# Patient Record
Sex: Male | Born: 1960 | Race: Black or African American | Hispanic: No | Marital: Married | Smoking: Never smoker
Health system: Southern US, Community
[De-identification: ages and names within clinical notes are randomized; demographics above are authoritative.]

## PROBLEM LIST (undated history)

## (undated) DIAGNOSIS — E785 Hyperlipidemia, unspecified: Secondary | ICD-10-CM

## (undated) DIAGNOSIS — N289 Disorder of kidney and ureter, unspecified: Secondary | ICD-10-CM

## (undated) DIAGNOSIS — K219 Gastro-esophageal reflux disease without esophagitis: Secondary | ICD-10-CM

## (undated) DIAGNOSIS — E119 Type 2 diabetes mellitus without complications: Secondary | ICD-10-CM

## (undated) HISTORY — PX: COLONOSCOPY: SHX174

## (undated) HISTORY — PX: NECK SURGERY: SHX720

## (undated) HISTORY — PX: OTHER SURGICAL HISTORY: SHX169

## (undated) HISTORY — DX: Type 2 diabetes mellitus without complications: E11.9

## (undated) HISTORY — PX: VASECTOMY: SHX75

## (undated) HISTORY — DX: Gastro-esophageal reflux disease without esophagitis: K21.9

## (undated) HISTORY — PX: ROTATOR CUFF REPAIR: SHX139

## (undated) HISTORY — DX: Hyperlipidemia, unspecified: E78.5

## (undated) HISTORY — PX: TONSILLECTOMY: SUR1361

## (undated) HISTORY — PX: FRACTURE SURGERY: SHX138

---

## 1998-10-17 ENCOUNTER — Encounter: Admission: RE | Admit: 1998-10-17 | Discharge: 1999-01-15 | Payer: Self-pay | Admitting: Endocrinology

## 1999-12-27 ENCOUNTER — Encounter: Admission: RE | Admit: 1999-12-27 | Discharge: 2000-03-26 | Payer: Self-pay | Admitting: Internal Medicine

## 2003-05-09 ENCOUNTER — Emergency Department (HOSPITAL_COMMUNITY): Admission: EM | Admit: 2003-05-09 | Discharge: 2003-05-09 | Payer: Self-pay | Admitting: Emergency Medicine

## 2003-07-31 ENCOUNTER — Emergency Department (HOSPITAL_COMMUNITY): Admission: EM | Admit: 2003-07-31 | Discharge: 2003-08-01 | Payer: Self-pay

## 2003-08-06 ENCOUNTER — Ambulatory Visit (HOSPITAL_COMMUNITY): Admission: RE | Admit: 2003-08-06 | Discharge: 2003-08-06 | Payer: Self-pay | Admitting: Endocrinology

## 2003-09-28 ENCOUNTER — Ambulatory Visit (HOSPITAL_COMMUNITY): Admission: RE | Admit: 2003-09-28 | Discharge: 2003-09-29 | Payer: Self-pay | Admitting: Neurosurgery

## 2003-10-20 ENCOUNTER — Encounter: Admission: RE | Admit: 2003-10-20 | Discharge: 2003-10-20 | Payer: Self-pay | Admitting: Neurosurgery

## 2008-08-12 ENCOUNTER — Encounter: Payer: Self-pay | Admitting: Gastroenterology

## 2008-10-06 ENCOUNTER — Ambulatory Visit: Payer: Self-pay | Admitting: Gastroenterology

## 2008-10-06 DIAGNOSIS — R195 Other fecal abnormalities: Secondary | ICD-10-CM | POA: Insufficient documentation

## 2008-10-27 ENCOUNTER — Ambulatory Visit: Payer: Self-pay | Admitting: Gastroenterology

## 2010-11-16 LAB — GLUCOSE, CAPILLARY
Glucose-Capillary: 151 mg/dL — ABNORMAL HIGH (ref 70–99)
Glucose-Capillary: 180 mg/dL — ABNORMAL HIGH (ref 70–99)

## 2010-12-22 NOTE — Op Note (Signed)
NAME:  RHYLEN, SHAHEEN                        ACCOUNT NO.:  [id number]   MEDICAL RECORD NO.:  [id number]                   PATIENT TYPE:  OIB   LOCATION:  3001                                 FACILITY:  [facility]   PHYSICIAN:  Reinaldo Meeker, M.D.              DATE OF BIRTH:  [birth year]   DATE OF PROCEDURE:  09/28/2003  DATE OF DISCHARGE:                                 OPERATIVE REPORT   PREOPERATIVE DIAGNOSIS:  Herniated disc C5-C6.   POSTOPERATIVE DIAGNOSIS:  Herniated disc C5-C6.   PROCEDURE:  C5-C6 anterior cervical discectomy with bone bank fusion  followed by Zephyr anterior cervical plating with the operating microscope.   SURGEON:  Reinaldo Meeker, M.D.   ASSISTANT:  Kathaleen Maser. Pool, M.D.   PROCEDURE IN DETAIL:  After being placed in the prone position and 10 pounds  of halter traction, the patient's neck was prepped and draped in the usual  sterile fashion.  Localizing x-ray was taken prior to the incision to  identify the appropriate level.  A transverse incision was made in the right  anterior neck starting at the midline and heading towards the medial aspect  of the sternocleidomastoid muscle.  The platysmal muscle was incised  transversely.  The natural fascial plane between the strap muscles medially  and the sternocleidomastoid muscle laterally was identified and followed  down to the anterior aspect of the cervical spine.  The longus colli muscles  were identified, split in the midline, stripped away bilaterally, and held  with a Art therapist.  Self-retaining retractor was placed for exposure and  an x-ray showed we approached the appropriate level.  Using the 15 blade,  the annulus of the disc was incised.  Using pituitaries, rongeurs, and  curets, approximately 90% of the disc material was removed.  A high speed  drill was used to widen the interspace.  The microscope was draped and  brought into the field and used until the end of the case.  Using  microdissection technique, the remaining disc material was removed.  Obvious  herniated disc material which was still subligamentous was removed.  The  posterior longitudinal ligament was then incised transversely and the cut  edge was removed with a Kerrison punch.  Decompression was carried out until  the proximal nerve roots could be identified bilaterally.  At this point,  inspection was carried out in all directions to look for any evidence of  residual compression and none could be identified.  Irrigation was carried  out.  Measurements were taken and an 8 mm bone bank plug was reconstituted.  After irrigating once more and confirming hemostasis, the plug was impacted  without difficulty and fluoroscopy showed it to be in good position.  A 22  mm Zephyr anterior cervical plate was then chosen.  Under fluoroscopic  guidance, drill holes were placed, followed by placement of 15 mm screws x  4.  Final fluoroscopy  showed the plate, plug, and screws to all be in good  position.  The locking mechanism was then secured.  The wound was irrigated  once more and any bleeding controlled with bipolar coagulation.  The wound  was then closed using interrupted Vicryl in the platysma muscle, inverted 5-  0 PDS on the subcuticular layer, and Steri-Strips on the skin.  Sterile  dressings and soft collar were applied and the patient was extubated and  taken to the recovery room in stable condition.                                               Reinaldo Meeker, M.D.    ROK/MEDQ  D:  09/28/2003  T:  09/28/2003  Job:  [id number]

## 2011-10-10 ENCOUNTER — Telehealth: Payer: Self-pay

## 2011-10-10 MED ORDER — GLIPIZIDE ER 2.5 MG PO TB24: 2.5000 mg | ORAL_TABLET | Freq: Every day | ORAL | Status: DC

## 2011-10-10 NOTE — Telephone Encounter (Signed)
SAW DR GUEST IN DEC, BUT NOW NEEDS REFILL ON GLIPIZIDE ER 5MG . THIS WAS LAST PRESCRIBED BY DR [place] Scrape, BUT PT SAID DR GUEST WOULD WRITE FOR THIS

## 2011-10-10 NOTE — Telephone Encounter (Signed)
CAN WE REFILL PT'S GLIPIZIDE? [facility] CHART IS UP FRONT FOR REVIEW.

## 2011-10-10 NOTE — Telephone Encounter (Signed)
We can RF this for one month but he will need to come in for lab work if he wishes [place] to prescribe this med for him regularly.  Please enter pharmacy and then med can be sent.

## 2011-10-10 NOTE — Telephone Encounter (Signed)
Spoke to pt , Nurse, adult...   He is aware he will ne to make appt for regular refills.

## 2011-10-10 NOTE — Telephone Encounter (Signed)
Med signed and sent to pharmacy

## 2011-11-14 ENCOUNTER — Ambulatory Visit (INDEPENDENT_AMBULATORY_CARE_PROVIDER_SITE_OTHER): Payer: 59 | Admitting: Family Medicine

## 2011-11-14 ENCOUNTER — Encounter: Payer: Self-pay | Admitting: Family Medicine

## 2011-11-14 VITALS — BP 110/71 | HR 63 | Temp 97.3°F | Resp 16 | Ht 66.0 in | Wt 210.2 lb

## 2011-11-14 DIAGNOSIS — Z0000 Encounter for general adult medical examination without abnormal findings: Secondary | ICD-10-CM

## 2011-11-14 DIAGNOSIS — Z125 Encounter for screening for malignant neoplasm of prostate: Secondary | ICD-10-CM

## 2011-11-14 DIAGNOSIS — E669 Obesity, unspecified: Secondary | ICD-10-CM

## 2011-11-14 DIAGNOSIS — IMO0001 Reserved for inherently not codable concepts without codable children: Secondary | ICD-10-CM

## 2011-11-14 DIAGNOSIS — E785 Hyperlipidemia, unspecified: Secondary | ICD-10-CM

## 2011-11-14 LAB — IFOBT (OCCULT BLOOD): IFOBT: NEGATIVE

## 2011-11-14 LAB — POCT GLYCOSYLATED HEMOGLOBIN (HGB A1C): Hemoglobin A1C: 7.5

## 2011-11-14 MED ORDER — GLIPIZIDE ER 2.5 MG PO TB24: 2.5000 mg | ORAL_TABLET | Freq: Every day | ORAL | Status: DC

## 2011-11-14 NOTE — Patient Instructions (Signed)
Obesity Obesity is defined as having a body mass index (BMI) of 30 or more. To calculate your BMI divide your weight in pounds by your height in inches squared and multiply that product by 703. Major illnesses resulting from long-term obesity include:  Stroke.   Heart disease.   Diabetes.   Many cancers.   Arthritis.  Obesity also complicates recovery from many other medical problems.  CAUSES   A history of obesity in your parents.   Thyroid hormone imbalance.   Environmental factors such as excess calorie intake and physical inactivity.  TREATMENT  A healthy weight loss program includes:  A calorie restricted diet based on individual calorie needs.   Increased physical activity (exercise).  An exercise program is just as important as the right low-calorie diet.  Weight-loss medicines should be used only under the supervision of your physician. These medicines help, but only if they are used with diet and exercise programs. Medicines can have side effects including nervousness, nausea, abdominal pain, diarrhea, headache, drowsiness, and depression.  An unhealthy weight loss program includes:  Fasting.   Fad diets.   Supplements and drugs.  These choices do not succeed in long-term weight control.  HOME CARE INSTRUCTIONS  To help you make the needed dietary changes:   Exercise and perform physical activity as directed by your caregiver.   Keep a daily record of everything you eat. There are many free websites to help you with this. It may be helpful to measure your foods so you can determine if you are eating the correct portion sizes.   Use low-calorie cookbooks or take special cooking classes.   Avoid alcohol. Drink more water and drinks with no calories.   Take vitamins and supplements only as recommended by your caregiver.   Weight loss support groups, Registered Dieticians, counselors, and stress reduction education can also be very helpful.  Document Released:  08/30/2004 Document Revised: 07/12/2011 Document Reviewed: 06/29/2007 [facility] Patient Information 2012 [place], [place]    .Keeping you healthy  Get these tests  Blood pressure- Have your blood pressure checked once a year by your healthcare provider.  Normal blood pressure is 120/80  Weight- Have your body mass index (BMI) calculated to screen for obesity.  BMI is a measure of body fat based on height and weight. You can also calculate your own BMI at [email or web address].  Cholesterol- Have your cholesterol checked every year.  Diabetes- Have your blood sugar checked regularly if you have high blood pressure, high cholesterol, have a family history of diabetes or if you are overweight.  Screening for Colon Cancer- Colonoscopy starting at age 21.  Screening may begin sooner depending on your family history and other health conditions. Follow up colonoscopy as directed by your Gastroenterologist.  Screening for Prostate Cancer- Both blood work (PSA) and a rectal exam help screen for Prostate Cancer.  Screening begins at age 13 with African-American men and at age 73 with Caucasian men.  Screening may begin sooner depending on your family history.  Take these medicines  Aspirin- One aspirin daily can help prevent Heart disease and Stroke.  Flu shot- Every fall.  Tetanus- Every 10 years.  Zostavax- Once after the age of 74 to prevent Shingles.  Pneumonia shot- Once after the age of 90; if you are younger than 73, ask your healthcare provider if you need a Pneumonia shot.  Take these steps  Don't smoke- If you do smoke, talk to your doctor about quitting.  For tips on how  to quit, go to www.smokefree.gov or call 1-800-QUIT-NOW.  Be physically active- Exercise 5 days a week for at least 30 minutes.  If you are not already physically active start slow and gradually work up to 30 minutes of moderate physical activity.  Examples of moderate activity include walking briskly, mowing  the yard, dancing, swimming, bicycling, etc.  Eat a healthy diet- Eat a variety of healthy food such as fruits, vegetables, low fat milk, low fat cheese, yogurt, lean meant, poultry, fish, beans, tofu, etc. For more information go to www.thenutritionsource.org  Drink alcohol in moderation- Limit alcohol intake to less than two drinks a day. Never drink and drive.  Dentist- Brush and floss twice daily; visit your dentist twice a year.  Depression- Your emotional health is as important as your physical health. If you're feeling down, or losing interest in things you would normally enjoy please talk to your healthcare provider.  Eye exam- Visit your eye doctor every year.  Safe sex- If you may be exposed to a sexually transmitted infection, use a condom.  Seat belts- Seat belts can save your life; always wear one.  Smoke/Carbon Monoxide detectors- These detectors need to be installed on the appropriate level of your home.  Replace batteries at least once a year.  Skin cancer- When out in the sun, cover up and use sunscreen 15 SPF or higher.  Violence- If anyone is threatening you, please tell your healthcare provider.  Living Will/ Health care power of attorney- Speak with your healthcare provider and family.

## 2011-11-15 LAB — LIPID PANEL
Cholesterol: 172 mg/dL (ref 0–200)
HDL: 60 mg/dL (ref >39)
LDL Cholesterol: 95 mg/dL (ref 0–99)
Total CHOL/HDL Ratio: 2.9 Ratio
Triglycerides: 84 mg/dL (ref <150)
VLDL: 17 mg/dL (ref 0–40)

## 2011-11-15 LAB — BASIC METABOLIC PANEL
BUN: 17 mg/dL (ref 6–23)
CO2: 27 mEq/L (ref 19–32)
Calcium: 9.7 mg/dL (ref 8.4–10.5)
Chloride: 105 mEq/L (ref 96–112)
Creat: 1.08 mg/dL (ref 0.50–1.35)
Glucose, Bld: 167 mg/dL — ABNORMAL HIGH (ref 70–99)
Potassium: 4.1 mEq/L (ref 3.5–5.3)
Sodium: 139 mEq/L (ref 135–145)

## 2011-11-15 LAB — ALT: ALT: 30 U/L (ref 0–53)

## 2011-11-15 LAB — PSA: PSA: 2.01 ng/mL (ref <=4.00)

## 2011-11-15 NOTE — Progress Notes (Addendum)
Quick Note:  Please call pt and advise that the following labs are abnormal...   Pt is Diabetic and blood sugar is elevated on this panel. All other labs are normal   His form is completed and ready at [facility].  A Copy of his labs will be given to him. __  Work-related form was faxed to appropriate number, copy of labs to be given to pt and form scanned into record.____

## 2011-11-16 ENCOUNTER — Encounter: Payer: Self-pay | Admitting: Family Medicine

## 2011-11-16 DIAGNOSIS — E119 Type 2 diabetes mellitus without complications: Secondary | ICD-10-CM | POA: Insufficient documentation

## 2011-11-16 DIAGNOSIS — Z0000 Encounter for general adult medical examination without abnormal findings: Secondary | ICD-10-CM | POA: Insufficient documentation

## 2011-11-16 DIAGNOSIS — E785 Hyperlipidemia, unspecified: Secondary | ICD-10-CM | POA: Insufficient documentation

## 2011-11-16 DIAGNOSIS — E669 Obesity, unspecified: Secondary | ICD-10-CM | POA: Insufficient documentation

## 2011-11-16 NOTE — Progress Notes (Signed)
  Subjective:    Patient ID: Carlos Matthews, male    DOB: 04/16/1961, 51 y.o.   MRN: [id number]  HPI Thiis 51 y.o AA male has HTN and Type II DM and is here for CPE. Checks FSBS at home: readings  are 87- 140. Denies hypoglycemia, polyuria or excessive thirst. Denies medication side effects.  Last eye exam: 2011  Last dental exam: 2011   Review of Systems  Constitutional: Negative.   HENT: Negative for hearing loss, congestion, sore throat, rhinorrhea, trouble swallowing, neck pain, dental problem, sinus pressure and tinnitus.   Eyes: Negative for photophobia, pain, redness and visual disturbance.  Respiratory: Negative for cough, chest tightness and shortness of breath.   Cardiovascular: Negative.   Gastrointestinal: Negative for nausea, vomiting, abdominal pain, constipation and blood in stool.  Genitourinary: Negative for dysuria, urgency, frequency, hematuria, discharge, difficulty urinating and testicular pain.  Musculoskeletal: Negative.   Neurological: Negative for dizziness, syncope, weakness, light-headedness, numbness and headaches.  Hematological: Negative.   Psychiatric/Behavioral: Negative.        Objective:   Physical Exam  Nursing note and vitals reviewed. Constitutional: He is oriented to person, place, and time. He appears well-developed and well-nourished. No distress.  HENT:  Head: Normocephalic and atraumatic.  Right Ear: External ear normal.  Left Ear: External ear normal.  Nose: Nose normal.  Mouth/Throat: Oropharynx is clear and moist.  Eyes: Conjunctivae and EOM are normal. Pupils are equal, round, and reactive to light. No scleral icterus.  Neck: Normal range of motion. Neck supple. No thyromegaly present.  Cardiovascular: Normal rate, regular rhythm, normal heart sounds and intact distal pulses.  Exam reveals no gallop and no friction rub.   No murmur heard. Pulmonary/Chest: Effort normal and breath sounds normal. No respiratory distress. He has no  rales. He exhibits no tenderness.  Abdominal: Soft. Bowel sounds are normal. He exhibits no distension and no mass. There is no tenderness.  Genitourinary: Rectum normal, prostate normal and penis normal. Guaiac negative stool.       Increased sphincter tone making exam difficult  Musculoskeletal: Normal range of motion. He exhibits no edema and no tenderness.  Lymphadenopathy:    He has no cervical adenopathy.  Neurological: He is alert and oriented to person, place, and time. He has normal reflexes. No cranial nerve deficit.  Skin: Skin is warm and dry. No rash noted.  Psychiatric: He has a normal mood and affect. His behavior is normal. Judgment and thought content normal.   A1c= 7.5%      Assessment & Plan:   1. Routine general medical examination at a health care facility  Healthcare advise given with focus on preventative issues and weight reduction  2. Type II or unspecified type diabetes mellitus without mention of complication, uncontrolled  POCT glycosylated hemoglobin (Hb A1C), Lipid panel, ALT, Basic metabolic panel  Continue current medications and improve nutrition  3. Screening for prostate cancer  PSA  4. Obesity (BMI 30.0-34.9)  Encouraged weight loss goal= 10-20 lbs.

## 2012-05-13 ENCOUNTER — Telehealth: Payer: Self-pay

## 2012-05-13 MED ORDER — METFORMIN HCL 1000 MG PO TABS: 1000.0000 mg | ORAL_TABLET | Freq: Two times a day (BID) | ORAL | Status: DC

## 2012-05-13 NOTE — Telephone Encounter (Signed)
PT STATES THE PHARMACY STATED THEY HAVE BEEN TRYING TO GET IN TOUCH WITH [place] FOR 2 WEEKS NOW REGARDING HIS METFORMIN AND THEY HAVEN'T RECEIVED A RESPONSE PLEASE CALL [phone]    [facility] ON [address]

## 2012-05-13 NOTE — Telephone Encounter (Signed)
Advised patient have not gotten this, have told him he is due for follow up, he will call back and make appt. Told him I will send in for 1 month.

## 2012-05-29 ENCOUNTER — Ambulatory Visit (INDEPENDENT_AMBULATORY_CARE_PROVIDER_SITE_OTHER): Payer: 59 | Admitting: Internal Medicine

## 2012-05-29 ENCOUNTER — Encounter: Payer: Self-pay | Admitting: Radiology

## 2012-05-29 VITALS — BP 130/80 | HR 65 | Temp 97.9°F | Resp 16 | Ht 66.0 in | Wt 209.0 lb

## 2012-05-29 DIAGNOSIS — Z23 Encounter for immunization: Secondary | ICD-10-CM

## 2012-05-29 DIAGNOSIS — I10 Essential (primary) hypertension: Secondary | ICD-10-CM

## 2012-05-29 DIAGNOSIS — E119 Type 2 diabetes mellitus without complications: Secondary | ICD-10-CM

## 2012-05-29 LAB — COMPREHENSIVE METABOLIC PANEL
ALT: 24 U/L (ref 0–53)
AST: 22 U/L (ref 0–37)
Albumin: 4.5 g/dL (ref 3.5–5.2)
Alkaline Phosphatase: 49 U/L (ref 39–117)
BUN: 15 mg/dL (ref 6–23)
CO2: 28 mEq/L (ref 19–32)
Calcium: 9.9 mg/dL (ref 8.4–10.5)
Chloride: 101 mEq/L (ref 96–112)
Creat: 1.1 mg/dL (ref 0.50–1.35)
Glucose, Bld: 148 mg/dL — ABNORMAL HIGH (ref 70–99)
Potassium: 4.2 mEq/L (ref 3.5–5.3)
Sodium: 138 mEq/L (ref 135–145)
Total Bilirubin: 0.6 mg/dL (ref 0.3–1.2)
Total Protein: 7.2 g/dL (ref 6.0–8.3)

## 2012-05-29 LAB — GLUCOSE, POCT (MANUAL RESULT ENTRY): POC Glucose: 153 mg/dl — AB (ref 70–99)

## 2012-05-29 LAB — POCT GLYCOSYLATED HEMOGLOBIN (HGB A1C): Hemoglobin A1C: 7.6

## 2012-05-29 MED ORDER — LISINOPRIL 5 MG PO TABS: 5.0000 mg | ORAL_TABLET | Freq: Every day | ORAL | Status: DC

## 2012-05-29 MED ORDER — METFORMIN HCL 1000 MG PO TABS: 1000.0000 mg | ORAL_TABLET | Freq: Two times a day (BID) | ORAL | Status: DC

## 2012-05-29 MED ORDER — GLIPIZIDE ER 2.5 MG PO TB24: 2.5000 mg | ORAL_TABLET | Freq: Every day | ORAL | Status: DC

## 2012-05-29 MED ORDER — SIMVASTATIN 40 MG PO TABS: 40.0000 mg | ORAL_TABLET | Freq: Every evening | ORAL | Status: DC

## 2012-05-29 NOTE — Progress Notes (Signed)
  Subjective:    Patient ID: Carlos Matthews, male    DOB: July 30, 1961, 51 y.o.   MRN: [id number]  HPI Doing great, lost weight, taking meds. Home glucoses 140s Seen eye doc No feet sensory changes   Review of Systems     Objective:   Physical Exam Sensation in tact,heart nl,lungs clear Results for orders placed in visit on 05/29/12  GLUCOSE, POCT (MANUAL RESULT ENTRY)      Component Value Range   POC Glucose 153 (*) 70 - 99 mg/dl      Results for orders placed in visit on 05/29/12  GLUCOSE, POCT (MANUAL RESULT ENTRY)      Component Value Range   POC Glucose 153 (*) 70 - 99 mg/dl  POCT GLYCOSYLATED HEMOGLOBIN (HGB A1C)      Component Value Range   Hemoglobin A1C 7.6      Assessment & Plan:  RF meds 1 yr

## 2012-05-29 NOTE — Patient Instructions (Addendum)
Diabetes and Exercise Regular exercise is important and can help:   Control blood glucose (sugar).  Decrease blood pressure.    Control blood lipids (cholesterol, triglycerides).  Improve overall health. BENEFITS FROM EXERCISE  Improved fitness.  Improved flexibility.  Improved endurance.  Increased bone density.  Weight control.  Increased muscle strength.  Decreased body fat.  Improvement of the body's use of insulin, a hormone.  Increased insulin sensitivity.  Reduction of insulin needs.  Reduced stress and tension.  Helps you feel better. People with diabetes who add exercise to their lifestyle gain additional benefits, including:  Weight loss.  Reduced appetite.  Improvement of the body's use of blood glucose.  Decreased risk factors for heart disease:  Lowering of cholesterol and triglycerides.  Raising the level of good cholesterol (high-density lipoproteins, HDL).  Lowering blood sugar.  Decreased blood pressure. TYPE 1 DIABETES AND EXERCISE  Exercise will usually lower your blood glucose.  If blood glucose is greater than 240 mg/dl, check urine ketones. If ketones are present, do not exercise.  Location of the insulin injection sites may need to be adjusted with exercise. Avoid injecting insulin into areas of the body that will be exercised. For example, avoid injecting insulin into:  The arms when playing tennis.  The legs when jogging. For more information, discuss this with your caregiver.  Keep a record of:  Food intake.  Type and amount of exercise.  Expected peak times of insulin action.  Blood glucose levels. Do this before, during, and after exercise. Review your records with your caregiver. This will help you to develop guidelines for adjusting food intake and insulin amounts.  TYPE 2 DIABETES AND EXERCISE  Regular physical activity can help control blood glucose.  Exercise is important because it may:  Increase the  body's sensitivity to insulin.  Improve blood glucose control.  Exercise reduces the risk of heart disease. It decreases serum cholesterol and triglycerides. It also lowers blood pressure.  Those who take insulin or oral hypoglycemic agents should watch for signs of hypoglycemia. These signs include dizziness, shaking, sweating, chills, and confusion.  Body water is lost during exercise. It must be replaced. This will help to avoid loss of body fluids (dehydration) or heat stroke. Be sure to talk to your caregiver before starting an exercise program to make sure it is safe for you. Remember, any activity is better than none.  Document Released: 10/13/2003 Document Revised: 10/15/2011 Document Reviewed: 01/27/2009 [facility] Patient Information 2013 [place], [place]. DASH Diet The DASH diet stands for "Dietary Approaches to Stop Hypertension." It is a healthy eating plan that has been shown to reduce high blood pressure (hypertension) in as little as 14 days, while also possibly providing other significant health benefits. These other health benefits include reducing the risk of breast cancer after menopause and reducing the risk of type 2 diabetes, heart disease, colon cancer, and stroke. Health benefits also include weight loss and slowing kidney failure in patients with chronic kidney disease.  DIET GUIDELINES  Limit salt (sodium). Your diet should contain less than 1500 mg of sodium daily.  Limit refined or processed carbohydrates. Your diet should include mostly whole grains. Desserts and added sugars should be used sparingly.  Include small amounts of heart-healthy fats. These types of fats include nuts, oils, and tub margarine. Limit saturated and trans fats. These fats have been shown to be harmful in the body. CHOOSING FOODS  The following food groups are based on a 2000 calorie diet. See your  Registered Dietitian for individual calorie needs. Grains and Grain Products (6 to 8 servings  daily)  Eat More Often: Whole-wheat bread, brown rice, whole-grain or wheat pasta, quinoa, popcorn without added fat or salt (air popped).  Eat Less Often: White bread, white pasta, white rice, cornbread. Vegetables (4 to 5 servings daily)  Eat More Often: Fresh, frozen, and canned vegetables. Vegetables may be raw, steamed, roasted, or grilled with a minimal amount of fat.  Eat Less Often/Avoid: Creamed or fried vegetables. Vegetables in a cheese sauce. Fruit (4 to 5 servings daily)  Eat More Often: All fresh, canned (in natural juice), or frozen fruits. Dried fruits without added sugar. One hundred percent fruit juice ( cup [237 mL] daily).  Eat Less Often: Dried fruits with added sugar. Canned fruit in light or heavy syrup. [employer], Fish, and Poultry (2 servings or less daily. One serving is 3 to 4 oz [85-114 g]).  Eat More Often: Ninety percent or leaner ground beef, tenderloin, sirloin. Round cuts of beef, chicken breast, [place] breast. All fish. Grill, bake, or broil your meat. Nothing should be fried.  Eat Less Often/Avoid: Fatty cuts of meat, [place], or chicken leg, thigh, or wing. Fried cuts of meat or fish. Dairy (2 to 3 servings)  Eat More Often: Low-fat or fat-free milk, low-fat plain or light yogurt, reduced-fat or part-skim cheese.  Eat Less Often/Avoid: Milk (whole, 2%).Whole milk yogurt. Full-fat cheeses. Nuts, Seeds, and Legumes (4 to 5 servings per week)  Eat More Often: All without added salt.  Eat Less Often/Avoid: Salted nuts and seeds, canned beans with added salt. Fats and Sweets (limited)  Eat More Often: Vegetable oils, tub margarines without trans fats, sugar-free gelatin. Mayonnaise and salad dressings.  Eat Less Often/Avoid: Coconut oils, palm oils, butter, stick margarine, cream, half and half, cookies, candy, pie. FOR MORE INFORMATION The Dash Diet Eating Plan: www.dashdiet.org Document Released: 07/12/2011 Document Revised: 10/15/2011 Document  Reviewed: 07/12/2011 [facility] Patient Information 2013 [place], [place].

## 2012-07-04 ENCOUNTER — Other Ambulatory Visit: Payer: Self-pay | Admitting: Internal Medicine

## 2012-09-25 ENCOUNTER — Ambulatory Visit: Payer: BC Managed Care – PPO

## 2012-10-18 ENCOUNTER — Other Ambulatory Visit: Payer: Self-pay | Admitting: Physician Assistant

## 2012-11-19 ENCOUNTER — Other Ambulatory Visit: Payer: Self-pay | Admitting: Physician Assistant

## 2013-05-27 ENCOUNTER — Other Ambulatory Visit: Payer: Self-pay | Admitting: Internal Medicine

## 2013-05-29 ENCOUNTER — Ambulatory Visit (INDEPENDENT_AMBULATORY_CARE_PROVIDER_SITE_OTHER): Payer: BC Managed Care – PPO

## 2013-05-29 VITALS — BP 129/82 | HR 81 | Resp 16 | Ht 66.0 in | Wt 205.0 lb

## 2013-05-29 DIAGNOSIS — B351 Tinea unguium: Secondary | ICD-10-CM

## 2013-05-29 DIAGNOSIS — E1142 Type 2 diabetes mellitus with diabetic polyneuropathy: Secondary | ICD-10-CM

## 2013-05-29 DIAGNOSIS — E1149 Type 2 diabetes mellitus with other diabetic neurological complication: Secondary | ICD-10-CM

## 2013-05-29 DIAGNOSIS — B353 Tinea pedis: Secondary | ICD-10-CM

## 2013-05-29 DIAGNOSIS — E1140 Type 2 diabetes mellitus with diabetic neuropathy, unspecified: Secondary | ICD-10-CM

## 2013-05-29 NOTE — Patient Instructions (Signed)

## 2013-05-29 NOTE — Progress Notes (Signed)
  Subjective:    Patient ID: Carlos Matthews, male    DOB: 1960-08-17, 52 y.o.   MRN: [id number]  HPI patient presents at this time for approximately 2 months status post initial visit. Has been applying topical formula 3 to the affected left great toenail has significant improvement in just 2 months. Patient also had a mostly resolution of tinea affecting his plantar left foot. There is still some hyperpigmentation of left foot however no maceration no malodor.    Review of Systems  All other systems reviewed and are negative.       Objective:   Physical Exam Neurovascular status is intact pedal pulses palpable DP postal for PT +2/4 bilateral. Epicritic and proprioceptive sensations intact bilateral with slight decreased sensation to the toes noted on [facility] testing. Dermatologically skin color normal there is some hyperpigmentation plantar arch area. Secondary to history of chronic tinea. The hallux nail plates versus a slight yellowing and thickening of ice continue with topical formula 3 as a preventative measure apply once a week or once every other week. As far as antifungal creams for the skin we use an as-needed basis if there's any recurrence or flareup. Did recommend lotion for his feet on a daily basis.       Assessment & Plan:  Assessment diabetes with mild peripheral neuropathy. Intact vascular status without complications. Also resolved tinea pedis and onychomycosis. Plan at this time is to maintain diabetic foot care. Dispensed literature about diabetic foot care. Suggested 12 month long-term followup  Alvan Dame DPM

## 2013-06-17 ENCOUNTER — Other Ambulatory Visit: Payer: Self-pay | Admitting: Internal Medicine

## 2013-06-29 ENCOUNTER — Telehealth: Payer: Self-pay

## 2013-06-29 MED ORDER — METFORMIN HCL 1000 MG PO TABS: ORAL_TABLET | ORAL | Status: DC

## 2013-06-29 NOTE — Telephone Encounter (Signed)
Needs metformin  Has appt on dec 9 with Audria [facility]  [phone]  [facility] [address]

## 2013-07-07 DIAGNOSIS — Z0271 Encounter for disability determination: Secondary | ICD-10-CM

## 2013-07-14 ENCOUNTER — Ambulatory Visit (INDEPENDENT_AMBULATORY_CARE_PROVIDER_SITE_OTHER): Payer: BC Managed Care – PPO | Admitting: Family Medicine

## 2013-07-14 ENCOUNTER — Encounter: Payer: Self-pay | Admitting: Family Medicine

## 2013-07-14 VITALS — BP 106/70 | HR 78 | Temp 98.1°F | Resp 16 | Ht 65.5 in | Wt 204.6 lb

## 2013-07-14 DIAGNOSIS — IMO0001 Reserved for inherently not codable concepts without codable children: Secondary | ICD-10-CM

## 2013-07-14 DIAGNOSIS — Z23 Encounter for immunization: Secondary | ICD-10-CM

## 2013-07-14 DIAGNOSIS — F5102 Adjustment insomnia: Secondary | ICD-10-CM

## 2013-07-14 DIAGNOSIS — I10 Essential (primary) hypertension: Secondary | ICD-10-CM

## 2013-07-14 LAB — LIPID PANEL
Cholesterol: 143 mg/dL (ref 0–200)
HDL: 47 mg/dL (ref >39)
LDL Cholesterol: 81 mg/dL (ref 0–99)
Total CHOL/HDL Ratio: 3 Ratio
Triglycerides: 73 mg/dL (ref <150)
VLDL: 15 mg/dL (ref 0–40)

## 2013-07-14 LAB — BASIC METABOLIC PANEL
BUN: 13 mg/dL (ref 6–23)
CO2: 26 mEq/L (ref 19–32)
Calcium: 9.5 mg/dL (ref 8.4–10.5)
Chloride: 102 mEq/L (ref 96–112)
Creat: 1.01 mg/dL (ref 0.50–1.35)
Glucose, Bld: 216 mg/dL — ABNORMAL HIGH (ref 70–99)
Potassium: 4.3 mEq/L (ref 3.5–5.3)
Sodium: 136 mEq/L (ref 135–145)

## 2013-07-14 LAB — POCT GLYCOSYLATED HEMOGLOBIN (HGB A1C): Hemoglobin A1C: 10.8

## 2013-07-14 MED ORDER — LISINOPRIL 5 MG PO TABS: 5.0000 mg | ORAL_TABLET | Freq: Every day | ORAL | Status: DC

## 2013-07-14 MED ORDER — METFORMIN HCL 1000 MG PO TABS: ORAL_TABLET | ORAL | Status: DC

## 2013-07-14 MED ORDER — ZOLPIDEM TARTRATE 10 MG PO TABS: ORAL_TABLET | ORAL | Status: DC

## 2013-07-14 MED ORDER — GLUCOSE BLOOD VI STRP: ORAL_STRIP | Status: AC

## 2013-07-14 MED ORDER — GLIPIZIDE ER 2.5 MG PO TB24: 2.5000 mg | ORAL_TABLET | Freq: Every day | ORAL | Status: DC

## 2013-07-14 NOTE — Progress Notes (Signed)
S:  This 52 y.o. AA male has Type II DM, last evaluated in 2013. Pt is compliant w/ medications but not checking FSBS due to malfunctioning glucometer. He also is not following a meal plan and is consuming sodas. He had blood work drawn for insurance purposes and A1c= 11.1% Pt wants this number rechecked. He denies any symptoms suggestive of hypoglycemia or elevated BS. He denies abnormal weight loss, fatigue, diaphoresis, CP or tightness, palpitations, SOB or DOE, GI problems, polyuria or polydipsia, hunger, HA, weakness, numbness or syncope.  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 He has been prescribed Ambien in the past; he takes medication (1/2 tablet) only if he will be off the road and at home for extended period of time. Pt does not ingest alcohol.  Patient Active Problem List   Diagnosis Date Noted  . Type 2 diabetes mellitus not at goal 11/16/2011    Priority: High  . Hyperlipidemia LDL goal < 100 11/16/2011    Priority: Medium  . Obesity (BMI 30.0-34.9) 11/16/2011    Priority: Medium  . Health care maintenance 11/16/2011   PMHx, Surg Hx, Soc and Fam Hx reviewed.  Medications reconciled.  ROS: As per HPI.  O: Filed Vitals:   07/14/13 1424  BP: 106/70  Pulse: 78  Temp: 98.1 F (36.7 C)  Resp: 16   GEN: In NAD; WN,WD. HENT: [place]/AT; EOMI w/ clear conj/ sclerae. EACs/nose/ oroph clear and moist.Otherwise unremarkable. COR: RRR. Normal S1 and S2; no m/g/r. LUNGS: Normal resp rate and effort. No wheezes or rhonchi. SKIN: W&D; intact w/o diaphoresis, erythema or rashes. MS: MAEs; no joint deformities, c/c/e. NEURO: A&O x 3; CNs intact. Nonfocal.   Results for orders placed in visit on 07/14/13  POCT GLYCOSYLATED HEMOGLOBIN (HGB A1C)      Result Value Range   Hemoglobin A1C 10.8     LABS results from 06/10/2013 [facility]) will be scanned into EPIC. A1c= 11.1%   Cr= 1.12   GFR= 93.86 mL/min    LFTs normal      PSA=1.94       CEA= 2.3 Lipids at goal- nonfasting values per pt (Chol/HDL ratio= 2.79)           HIV non-reactive      A/P: Type II or unspecified type diabetes mellitus without mention of complication, uncontrolled - Plan: Encouraged healthier nutrition practices; eliminate sodas and other concentrated sugars. 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  HTN (hypertension) - Stable and well controlled on current medication. Plan: Basic metabolic panel, TSH  Need for prophylactic vaccination and inoculation against influenza - Plan: Flu Vaccine QUAD 36+ mos IM   Meds ordered this encounter  Medications  . glipiZIDE (GLUCOTROL XL) 2.5 MG 24 hr tablet    Sig: Take 1 tablet (2.5 mg total) by mouth daily with breakfast.    Dispense:  30 tablet    Refill:  5  . lisinopril (PRINIVIL,ZESTRIL) 5 MG tablet    Sig: Take 1 tablet (5 mg total) by mouth daily.    Dispense:  30 tablet    Refill:  5  . metFORMIN (GLUCOPHAGE) 1000 MG tablet    Sig: TAKE ONE TABLET BY MOUTH TWICE DAILY WITH MEALS    Dispense:  60 tablet  Refill:  5  . glucose blood test strip    Sig: Use as instructed    Dispense:  100 each    Refill:  12  . zolpidem (AMBIEN) 10 MG tablet    Sig: Take 1/2 - 1 tablet at bedtime as needed for sleep.    Dispense:  30 tablet    Refill:  0

## 2013-07-15 LAB — TSH: TSH: 0.43 u[IU]/mL (ref 0.350–4.500)

## 2013-07-17 NOTE — Progress Notes (Signed)
Quick Note:  Please advise pt regarding following labs... Blood sugar above normal but all other labs normal. Cholesterol and lipid panel looks great.  Copy to pt. ______

## 2013-07-29 ENCOUNTER — Other Ambulatory Visit: Payer: Self-pay | Admitting: Internal Medicine

## 2013-07-29 ENCOUNTER — Telehealth: Payer: Self-pay

## 2013-07-29 NOTE — Telephone Encounter (Signed)
Patient states that his blood sugar levels are not coming down with his current medication Metformin and Glipizide. Patient wants to know if something else can be called in.  Nicolette Bang on [employer]   [phone]

## 2013-07-29 NOTE — Telephone Encounter (Signed)
Spoke with pt, he states his blood sugars are running 200, 250 and he is taking his medication as prescribed. He has been on this medication for a long time but thinks they are not working anymore. Please advise.

## 2013-07-31 MED ORDER — GLIPIZIDE ER 2.5 MG PO TB24: ORAL_TABLET | ORAL | Status: DC

## 2013-07-31 NOTE — Telephone Encounter (Signed)
Please advise pt to increase Glipizide XL 2.5 mg to 2 tablets every morning with breakfast. Remind him that he should be following a meal plan, avoiding food and drinks with high sugar and starch content. I will change the quantity of tablets that he gets each month to 60. I will see him as scheduled in February 2015.

## 2013-08-01 NOTE — Telephone Encounter (Signed)
Advised pt. He will keep a log of his sugars and notify us of any significant changes. He confirms he will be here in Feb for his appt.

## 2013-09-17 ENCOUNTER — Encounter: Payer: Self-pay | Admitting: Family Medicine

## 2013-09-17 ENCOUNTER — Ambulatory Visit (INDEPENDENT_AMBULATORY_CARE_PROVIDER_SITE_OTHER): Payer: BC Managed Care – PPO | Admitting: Family Medicine

## 2013-09-17 VITALS — BP 102/60 | HR 69 | Temp 97.8°F | Resp 16 | Ht 66.0 in | Wt 203.0 lb

## 2013-09-17 DIAGNOSIS — E119 Type 2 diabetes mellitus without complications: Secondary | ICD-10-CM

## 2013-09-17 DIAGNOSIS — E785 Hyperlipidemia, unspecified: Secondary | ICD-10-CM

## 2013-09-17 DIAGNOSIS — Z0000 Encounter for general adult medical examination without abnormal findings: Secondary | ICD-10-CM

## 2013-09-17 DIAGNOSIS — Z23 Encounter for immunization: Secondary | ICD-10-CM

## 2013-09-17 DIAGNOSIS — E7800 Pure hypercholesterolemia, unspecified: Secondary | ICD-10-CM

## 2013-09-17 DIAGNOSIS — G479 Sleep disorder, unspecified: Secondary | ICD-10-CM | POA: Insufficient documentation

## 2013-09-17 LAB — POCT URINALYSIS DIPSTICK
Bilirubin, UA: NEGATIVE
Blood, UA: NEGATIVE
Glucose, UA: NEGATIVE
KETONES UA: NEGATIVE
Leukocytes, UA: NEGATIVE
Nitrite, UA: NEGATIVE
PROTEIN UA: NEGATIVE
Spec Grav, UA: 1.015
Urobilinogen, UA: 0.2
pH, UA: 7

## 2013-09-17 LAB — IFOBT (OCCULT BLOOD): IFOBT: NEGATIVE

## 2013-09-17 NOTE — Progress Notes (Signed)
Subjective:    Patient ID: Carlos Matthews, male    DOB: 09-18-1960, 53 y.o.   MRN: [id number]  HPI  This 53 y.o. AA male is here for CPE, HE has Type II DM and dyslipidemia. HE has no c/os and monitors FSBS most days of the week and if he does not feel well. He has changed nutrition and is actively working on weight loss.  HCM: Vision- Annually.            CRS- 2010 (normal).            IMM- Current.  Patient Active Problem List   Diagnosis Date Noted  . Type 2 diabetes mellitus not at goal 11/16/2011    Priority: High  . Hyperlipidemia LDL goal < 100 11/16/2011    Priority: Medium  . Obesity (BMI 30.0-34.9) 11/16/2011    Priority: Medium  . Health care maintenance 11/16/2011    Prior to Admission medications   Medication Sig Start Date End Date Taking? Authorizing Provider  glipiZIDE (GLUCOTROL XL) 2.5 MG 24 hr tablet Take 2 tablets every morning before breakfast. 07/31/13  Yes Maurice March, MD  glucose blood test strip Use as instructed 07/14/13  Yes Maurice March, MD  lisinopril (PRINIVIL,ZESTRIL) 5 MG tablet Take 1 tablet (5 mg total) by mouth daily. 07/14/13  Yes Maurice March, MD  metFORMIN (GLUCOPHAGE) 1000 MG tablet TAKE ONE TABLET BY MOUTH TWICE DAILY WITH MEALS 07/14/13  Yes Maurice March, MD  simvastatin (ZOCOR) 40 MG tablet TAKE ONE TABLET BY MOUTH EVERY DAY IN THE EVENING 07/29/13  Yes Morrell Riddle, PA-C  zolpidem (AMBIEN) 10 MG tablet Take 1/2 - 1 tablet at bedtime as needed for sleep. 07/14/13  Yes Maurice March, MD   PMHx, Surg Hx, Soc and Fam Hx reviewed.  Review of Systems  Constitutional: Negative.   HENT: Negative.   Eyes: Positive for redness.  Respiratory: Negative.   Cardiovascular: Negative.   Gastrointestinal: Negative.   Endocrine: Negative.   Genitourinary: Negative.   Musculoskeletal: Negative.   Neurological: Negative.   Psychiatric/Behavioral: Positive for sleep disturbance.  All other systems reviewed and are  negative.      Objective:   Physical Exam  Nursing note and vitals reviewed. Constitutional: He is oriented to person, place, and time. Vital signs are normal. He appears well-developed and well-nourished. No distress.  HENT:  Head: Normocephalic and atraumatic.  Right Ear: Hearing, tympanic membrane, external ear and ear canal normal.  Left Ear: Hearing, tympanic membrane, external ear and ear canal normal.  Nose: Mucosal edema present. No nasal deformity or septal deviation.  Mouth/Throat: Uvula is midline, oropharynx is clear and moist and mucous membranes are normal. No oral lesions. Normal dentition.  Eyes: EOM and lids are normal. Pupils are equal, round, and reactive to light. Right conjunctiva is injected. Left conjunctiva is injected. No scleral icterus.  Fundoscopic exam:      The right eye shows no arteriolar narrowing, no hemorrhage and no papilledema. The right eye shows red reflex.       The left eye shows no arteriolar narrowing, no hemorrhage and no papilledema. The left eye shows red reflex.  Neck: Trachea normal, normal range of motion and full passive range of motion without pain. Neck supple. No JVD present. No spinous process tenderness and no muscular tenderness present. No mass and no thyromegaly present.  Cardiovascular: Normal rate, regular rhythm, S1 normal, S2 normal, normal heart sounds and normal pulses.  No extrasystoles are present. PMI is not displaced.  Exam reveals no gallop and no friction rub.   No murmur heard. Pulmonary/Chest: Effort normal and breath sounds normal. No respiratory distress.  Abdominal: Soft. Normal appearance and bowel sounds are normal. He exhibits no distension, no abdominal bruit, no pulsatile midline mass and no mass. There is no hepatosplenomegaly. There is no tenderness. There is no guarding and no CVA tenderness. No hernia.  Genitourinary: Prostate normal. Rectal exam shows anal tone abnormal. Rectal exam shows no external  hemorrhoid, no internal hemorrhoid, no fissure, no mass and no tenderness. Guaiac negative stool. Prostate is not enlarged and not tender.  Abnormally increased sphincter tone.  Musculoskeletal: Normal range of motion. He exhibits no edema and no tenderness.  Lymphadenopathy:       Head (right side): No submental, no submandibular, no tonsillar, no preauricular, no posterior auricular and no occipital adenopathy present.       Head (left side): No submental, no submandibular, no tonsillar, no preauricular, no posterior auricular and no occipital adenopathy present.    He has no cervical adenopathy.       Right cervical: No superficial cervical, no deep cervical and no posterior cervical adenopathy present.      Left cervical: No superficial cervical, no deep cervical and no posterior cervical adenopathy present.    He has no axillary adenopathy.       Right: No inguinal and no supraclavicular adenopathy present.       Left: No inguinal and no supraclavicular adenopathy present.  Neurological: He is alert and oriented to person, place, and time. He has normal strength. He displays no atrophy. No cranial nerve deficit or sensory deficit. He exhibits normal muscle tone. He displays a negative Romberg sign. Coordination and gait normal. He displays no Babinski's sign on the right side. He displays no Babinski's sign on the left side.  Reflex Scores:      Tricep reflexes are 1+ on the right side and 1+ on the left side.      Bicep reflexes are 2+ on the right side and 2+ on the left side.      Patellar reflexes are 2+ on the right side and 2+ on the left side. Skin: Skin is warm, dry and intact. No ecchymosis, no lesion and no rash noted. He is not diaphoretic. No cyanosis or erythema. No pallor. Nails show no clubbing.  Psychiatric: He has a normal mood and affect. His speech is normal and behavior is normal. Judgment and thought content normal. Cognition and memory are normal.    Results for orders  placed in visit on 09/17/13  POCT URINALYSIS DIPSTICK      Result Value Ref Range   Color, UA yellow     Clarity, UA clear     Glucose, UA neg     Bilirubin, UA neg     Ketones, UA neg     Spec Grav, UA 1.015     Blood, UA neg     pH, UA 7.0     Protein, UA neg     Urobilinogen, UA 0.2     Nitrite, UA neg     Leukocytes, UA Negative    IFOBT (OCCULT BLOOD)      Result Value Ref Range   IFOBT Negative        Assessment & Plan:  Annual physical exam - Plan: POCT urinalysis dipstick, IFOBT POC (occult bld, rslt in office)  Type 2 diabetes mellitus not at  goal - A1c in Dec 2014= 10.8 %. Will check at next visit. Encouraged better nutrition and weight reduction. Plan: HM Diabetes Foot Exam  Hyperlipidemia LDL goal < 100- Continue current medication and nutrition modifications.  Need for Tdap vaccination - Plan: Tdap vaccine greater than or equal to 7yo IM

## 2013-09-17 NOTE — Patient Instructions (Addendum)
You will return in 3 months for Diabetes follow-up; we will do labs at that time (A1c, CMET, PSA- prostate blood test).  Continue all current medications and healthy nutrition.  You have problems sleeping- Try MELATONIN 3 mg tablet  1 at bedtime for restful sleep.

## 2013-11-24 ENCOUNTER — Other Ambulatory Visit: Payer: Self-pay | Admitting: Family Medicine

## 2013-12-24 ENCOUNTER — Encounter: Payer: Self-pay | Admitting: Family Medicine

## 2013-12-24 ENCOUNTER — Ambulatory Visit (INDEPENDENT_AMBULATORY_CARE_PROVIDER_SITE_OTHER): Payer: BC Managed Care – PPO | Admitting: Family Medicine

## 2013-12-24 VITALS — BP 96/61 | HR 56 | Temp 98.4°F | Resp 16 | Ht 65.5 in | Wt 204.0 lb

## 2013-12-24 DIAGNOSIS — E1165 Type 2 diabetes mellitus with hyperglycemia: Principal | ICD-10-CM

## 2013-12-24 DIAGNOSIS — IMO0001 Reserved for inherently not codable concepts without codable children: Secondary | ICD-10-CM

## 2013-12-24 LAB — POCT GLYCOSYLATED HEMOGLOBIN (HGB A1C): Hemoglobin A1C: 8.6

## 2013-12-24 MED ORDER — METFORMIN HCL 1000 MG PO TABS: ORAL_TABLET | ORAL | Status: DC

## 2013-12-24 MED ORDER — ZOLPIDEM TARTRATE 10 MG PO TABS
ORAL_TABLET | ORAL | Status: DC
Start: 2013-12-24 — End: 2014-08-23

## 2013-12-24 MED ORDER — LISINOPRIL 5 MG PO TABS: 5.0000 mg | ORAL_TABLET | Freq: Every day | ORAL | Status: DC

## 2013-12-24 MED ORDER — GLIPIZIDE ER 2.5 MG PO TB24: ORAL_TABLET | ORAL | Status: DC

## 2013-12-24 NOTE — Patient Instructions (Addendum)
Your Diabetes is under good control. The goal is to get the A1c down to about 7%. You are on the right track. Continue to eat as healthy as you can. Drink plenty of water and some fruit juices. Stay active.  As we discussed. Cut the BP pill in half (Lisinopril 5 mg tablet). If you are not able to cut this tablet in half, I will prescribe the lower dose tablet = Lisinopril 2.5 mg.

## 2013-12-24 NOTE — Progress Notes (Signed)
S:  This 53 y.o. AA male has Type II DM; he is compliant w/ medications w/o adverse effects. He has normal BP but takes Lisinopril for reno-protective effect. He does not c/o lightheadedness or dizziness. FSBS= 100-130. He denies fatigue, weight loss, diaphoresis, CP or palpitations, SOB, cough, numbness or weakness. He drinks mostly  Water, has eliminated cokes as well as most starchy foods.  Patient Active Problem List   Diagnosis Date Noted  . Type 2 diabetes mellitus not at goal 11/16/2011    Priority: High  . Hyperlipidemia LDL goal < 100 11/16/2011    Priority: Medium  . Obesity (BMI 30.0-34.9) 11/16/2011    Priority: Medium  . Sleep disorder 09/17/2013  . Health care maintenance 11/16/2011   Prior to Admission medications   Medication Sig Start Date End Date Taking? Authorizing Provider  glipiZIDE (GLUCOTROL XL) 2.5 MG 24 hr tablet TAKE TWO TABLETS BY MOUTH EVERY MORNING BEFORE BREAKFAST.   Yes Maurice MarchBarbara B Iline Buchinger, MD  glucose blood test strip Use as instructed 07/14/13  Yes Maurice MarchBarbara B Prentice Sackrider, MD  lisinopril (PRINIVIL,ZESTRIL) 5 MG tablet Take 1 tablet (5 mg total) by mouth daily.   Yes Maurice MarchBarbara B Nicholous Girgenti, MD  metFORMIN (GLUCOPHAGE) 1000 MG tablet TAKE ONE TABLET BY MOUTH TWICE DAILY WITH MEALS   Yes Maurice MarchBarbara B Lataysha Vohra, MD  simvastatin (ZOCOR) 40 MG tablet TAKE ONE TABLET BY MOUTH EVERY DAY IN THE EVENING 07/29/13  Yes Morrell RiddleSarah L Weber, PA-C  zolpidem (AMBIEN) 10 MG tablet Take 1/2 - 1 tablet at bedtime as needed for sleep.   Yes Maurice MarchBarbara B Erhardt Dada, MD   PMHx, Surg Hx, Soc and Fam Hx.  ROS: AS per HPI.  O: Filed Vitals:   12/24/13 0839  BP: 96/61  Pulse: 56  Temp: 98.4 F (36.9 C)  Resp: 16   GEN: In NAD; WN,WD> HENT: [place]/AT; EOMI w/ clear conj/sclerae. Otherwise unremarkable. COR: RRR. LUNGS: Unlabored resp. SKIN: W&D; intact w/o diaphoresis. NEURO: A&O x 3; CNs intact. Nonfocal.  Results for orders placed in visit on 12/24/13  POCT GLYCOSYLATED HEMOGLOBIN (HGB A1C)       Result Value Ref Range   Hemoglobin A1C 8.6      A/P: Type 2 diabetes mellitus not at goal - Improved control. Advised pt to cut Lisinopril 5 mg tablet in half; if unable to cut tablet, will need RX for Lisinopril 2.5 mg tablets. Plan: HM Diabetes Foot Exam, POCT glycosylated hemoglobin (Hb A1C)  Meds ordered this encounter  Medications  . glipiZIDE (GLUCOTROL XL) 2.5 MG 24 hr tablet    Sig: TAKE TWO TABLETS BY MOUTH EVERY MORNING BEFORE BREAKFAST.    Dispense:  60 tablet    Refill:  5  . lisinopril (PRINIVIL,ZESTRIL) 5 MG tablet    Sig: Take 1 tablet (5 mg total) by mouth daily.    Dispense:  30 tablet    Refill:  5  . metFORMIN (GLUCOPHAGE) 1000 MG tablet    Sig: TAKE ONE TABLET BY MOUTH TWICE DAILY WITH MEALS    Dispense:  60 tablet    Refill:  5  . zolpidem (AMBIEN) 10 MG tablet    Sig: Take 1/2 - 1 tablet at bedtime as needed for sleep.    Dispense:  30 tablet    Refill:  0

## 2014-04-30 ENCOUNTER — Ambulatory Visit: Payer: BC Managed Care – PPO | Admitting: Family Medicine

## 2014-07-02 ENCOUNTER — Other Ambulatory Visit: Payer: Self-pay | Admitting: Family Medicine

## 2014-07-31 ENCOUNTER — Other Ambulatory Visit: Payer: Self-pay | Admitting: Physician Assistant

## 2014-07-31 ENCOUNTER — Other Ambulatory Visit: Payer: Self-pay | Admitting: Family Medicine

## 2014-08-16 ENCOUNTER — Telehealth: Payer: Self-pay | Admitting: Physician Assistant

## 2014-08-16 DIAGNOSIS — E119 Type 2 diabetes mellitus without complications: Secondary | ICD-10-CM

## 2014-08-17 NOTE — Telephone Encounter (Signed)
Called pt who reported that the last 2 times he had appt scheduled we had called and cancelled., and "he didn't know what to do". I advised pt that I will send in 1 more mos RF of both meds and send notice to Scheduling to call him to sch appt. Pt stated that he can also f/up tomorrow and call himself. Scheduling, please call pt if he has not already been in touch.

## 2014-08-19 NOTE — Telephone Encounter (Signed)
Pt called saying that his pharmacy never received the refill request for Metformin and glipizide. Please resend. [facility] [address]

## 2014-08-19 NOTE — Telephone Encounter (Signed)
Spoke to pt, [facility]

## 2014-08-23 ENCOUNTER — Encounter: Payer: Self-pay | Admitting: Family Medicine

## 2014-08-23 ENCOUNTER — Ambulatory Visit (INDEPENDENT_AMBULATORY_CARE_PROVIDER_SITE_OTHER): Payer: BLUE CROSS/BLUE SHIELD | Admitting: Family Medicine

## 2014-08-23 ENCOUNTER — Other Ambulatory Visit: Payer: Self-pay | Admitting: Family Medicine

## 2014-08-23 VITALS — BP 118/74 | HR 80 | Temp 98.9°F | Resp 16 | Ht 65.75 in | Wt 207.0 lb

## 2014-08-23 DIAGNOSIS — F5102 Adjustment insomnia: Secondary | ICD-10-CM

## 2014-08-23 DIAGNOSIS — Z125 Encounter for screening for malignant neoplasm of prostate: Secondary | ICD-10-CM

## 2014-08-23 DIAGNOSIS — I10 Essential (primary) hypertension: Secondary | ICD-10-CM

## 2014-08-23 DIAGNOSIS — E119 Type 2 diabetes mellitus without complications: Secondary | ICD-10-CM

## 2014-08-23 DIAGNOSIS — Z1211 Encounter for screening for malignant neoplasm of colon: Secondary | ICD-10-CM

## 2014-08-23 LAB — CBC
HCT: 39.9 % (ref 39.0–52.0)
HEMOGLOBIN: 12.3 g/dL — AB (ref 13.0–17.0)
MCH: 21.1 pg — ABNORMAL LOW (ref 26.0–34.0)
MCHC: 30.8 g/dL (ref 30.0–36.0)
MCV: 68.6 fL — AB (ref 78.0–100.0)
Platelets: 252 10*3/uL (ref 150–400)
RBC: 5.82 MIL/uL — ABNORMAL HIGH (ref 4.22–5.81)
RDW: 17.3 % — AB (ref 11.5–15.5)
WBC: 5.4 10*3/uL (ref 4.0–10.5)

## 2014-08-23 LAB — LIPID PANEL
CHOL/HDL RATIO: 2.8 ratio
CHOLESTEROL: 151 mg/dL (ref 0–200)
HDL: 53 mg/dL (ref >39)
LDL CALC: 82 mg/dL (ref 0–99)
Triglycerides: 80 mg/dL (ref <150)
VLDL: 16 mg/dL (ref 0–40)

## 2014-08-23 LAB — COMPREHENSIVE METABOLIC PANEL
ALBUMIN: 4.3 g/dL (ref 3.5–5.2)
ALT: 20 U/L (ref 0–53)
AST: 16 U/L (ref 0–37)
Alkaline Phosphatase: 52 U/L (ref 39–117)
BUN: 15 mg/dL (ref 6–23)
CO2: 25 mEq/L (ref 19–32)
Calcium: 9.4 mg/dL (ref 8.4–10.5)
Chloride: 100 mEq/L (ref 96–112)
Creat: 1.06 mg/dL (ref 0.50–1.35)
GLUCOSE: 203 mg/dL — AB (ref 70–99)
POTASSIUM: 4.6 meq/L (ref 3.5–5.3)
Sodium: 135 mEq/L (ref 135–145)
Total Bilirubin: 0.4 mg/dL (ref 0.2–1.2)
Total Protein: 7.2 g/dL (ref 6.0–8.3)

## 2014-08-23 LAB — HEMOGLOBIN A1C
Hgb A1c MFr Bld: 9.9 % — ABNORMAL HIGH (ref <5.7)
MEAN PLASMA GLUCOSE: 237 mg/dL — AB (ref <117)

## 2014-08-23 MED ORDER — ZOLPIDEM TARTRATE 10 MG PO TABS: ORAL_TABLET | ORAL | Status: DC

## 2014-08-23 NOTE — Progress Notes (Signed)
   Subjective:    Patient ID: Janann Augustercy R Binette, male    DOB: 1961-07-27, 54 y.o.   MRN: [id number]  HPI This is a pleasant 54 yo male who presents today in follow up of his diabetes. He is tolerating his medications without side effects. He reports that he watches his diet most of the time. He [place] [employer], but has not been exercising as often as he did in the past due to his work hours.  He rarely checks his blood sugar.   Doesn't sleep well. Hasn't for years. Has used Ambien in the past with good relief and no side effects. 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 He is home every night. He typically goes to bed around 11 and gets up around 7-8. He drinks little caffeine throughout the day. He does not watch much TV or use a computer/electronic screen in the evening.  He does have occasional acid indigestion if waits too long to eat or eats spicy or greasy food. Symptoms are relieved quickly with 2 Tums. He has symptoms 1-2x per week.   He has been to the opthomologist within the last 3 months.    Review of Systems No chest pain, no SOB, no dizziness, no light headedness, no diarrhea, no constipation, no abdominal pain, no edema.    Objective:   Physical Exam  Constitutional: He is oriented to person, place, and time. He appears well-developed and well-nourished.  HENT:  Head: Normocephalic and atraumatic.  Eyes: Conjunctivae are normal.  Neck: Normal range of motion. Neck supple.  Cardiovascular: Normal rate, regular rhythm and normal heart sounds.   Pulmonary/Chest: Effort normal and breath sounds normal.  Musculoskeletal: Normal range of motion. He exhibits no edema.  Neurological: He is alert and oriented to person, place, and time.  Skin: Skin is warm and dry.  Psychiatric: He has a normal mood and affect. His behavior is normal. Judgment and thought content normal.  Vitals reviewed.  BP 118/74 mmHg  Pulse 80  Temp(Src) 98.9 F (37.2 C) (Oral)  Resp 16  Ht  5' 5.75" (1.67 m)  Wt 207 lb (93.895 kg)  BMI 33.67 kg/m2  SpO2 100%    Assessment & Plan:  1. Type 2 diabetes mellitus without complication - CBC - Comprehensive metabolic panel - Lipid panel - Hemoglobin A1C - will wait for labs to reorder meds - discussed importance of regular follow up and need to be seen at least every 6 months  2. Insomnia, transient -provided written and verbal information regarding insomnia - Ambien 10 mg 1/2-1 tablet qhs prn  3. Essential hypertension - Comprehensive metabolic panel  4. Screening for prostate cancer - PSA   Emi Belfasteborah B. Tamarah Bhullar, FNP-BC  [facility]  08/23/2014 8:00 PM

## 2014-08-23 NOTE — Patient Instructions (Addendum)
Try melatonin for 2-3 weeks for sleep. Take zantac 150 mg (generic fine) at bedtime for GERD  Insomnia Insomnia is frequent trouble falling and/or staying asleep. Insomnia can be a long term problem or a short term problem. Both are common. Insomnia can be a short term problem when the wakefulness is related to a certain stress or worry. Long term insomnia is often related to ongoing stress during waking hours and/or poor sleeping habits. Overtime, sleep deprivation itself can make the problem worse. Every little thing feels more severe because you are overtired and your ability to cope is decreased. CAUSES   Stress, anxiety, and depression.  Poor sleeping habits.  Distractions such as TV in the bedroom.  Naps close to bedtime.  Engaging in emotionally charged conversations before bed.  Technical reading before sleep.  Alcohol and other sedatives. They may make the problem worse. They can hurt normal sleep patterns and normal dream activity.  Stimulants such as caffeine for several hours prior to bedtime.  Pain syndromes and shortness of breath can cause insomnia.  Exercise late at night.  Changing time zones may cause sleeping problems (jet lag). It is sometimes helpful to have someone observe your sleeping patterns. They should look for periods of not breathing during the night (sleep apnea). They should also look to see how long those periods last. If you live alone or observers are uncertain, you can also be observed at a sleep clinic where your sleep patterns will be professionally monitored. Sleep apnea requires a checkup and treatment. Give your caregivers your medical history. Give your caregivers observations your family has made about your sleep.  SYMPTOMS   Not feeling rested in the morning.  Anxiety and restlessness at bedtime.  Difficulty falling and staying asleep. TREATMENT   Your caregiver may prescribe treatment for an underlying medical disorders. Your  caregiver can give advice or help if you are using alcohol or other drugs for self-medication. Treatment of underlying problems will usually eliminate insomnia problems.  Medications can be prescribed for short time use. They are generally not recommended for lengthy use.  Over-the-counter sleep medicines are not recommended for lengthy use. They can be habit forming.  You can promote easier sleeping by making lifestyle changes such as:  Using relaxation techniques that help with breathing and reduce muscle tension.  Exercising earlier in the day.  Changing your diet and the time of your last meal. No night time snacks.  Establish a regular time to go to bed.  Counseling can help with stressful problems and worry.  Soothing music and white noise may be helpful if there are background noises you cannot remove.  Stop tedious detailed work at least one hour before bedtime. HOME CARE INSTRUCTIONS   Keep a diary. Inform your caregiver about your progress. This includes any medication side effects. See your caregiver regularly. Take note of:  Times when you are asleep.  Times when you are awake during the night.  The quality of your sleep.  How you feel the next day. This information will help your caregiver care for you.  Get out of bed if you are still awake after 15 minutes. Read or do some quiet activity. Keep the lights down. Wait until you feel sleepy and go back to bed.  Keep regular sleeping and waking hours. Avoid naps.  Exercise regularly.  Avoid distractions at bedtime. Distractions include watching television or engaging in any intense or detailed activity like attempting to balance the household checkbook.  Develop  a bedtime ritual. Keep a familiar routine of bathing, brushing your teeth, climbing into bed at the same time each night, listening to soothing music. Routines increase the success of falling to sleep faster.  Use relaxation techniques. This can be using  breathing and muscle tension release routines. It can also include visualizing peaceful scenes. You can also help control troubling or intruding thoughts by keeping your mind occupied with boring or repetitive thoughts like the old concept of counting sheep. You can make it more creative like imagining planting one beautiful flower after another in your backyard garden.  During your day, work to eliminate stress. When this is not possible use some of the previous suggestions to help reduce the anxiety that accompanies stressful situations. MAKE SURE YOU:   Understand these instructions.  Will watch your condition.  Will get help right away if you are not doing well or get worse. Document Released: 07/20/2000 Document Revised: 10/15/2011 Document Reviewed: 08/20/2007 [facility] Patient Information 2015 [place]. This information is not intended to replace advice given to you by your health care provider. Make sure you discuss any questions you have with your health care provider. Gastroesophageal Reflux Disease, Adult Gastroesophageal reflux disease (GERD) happens when acid from your stomach flows up into the esophagus. When acid comes in contact with the esophagus, the acid causes soreness (inflammation) in the esophagus. Over time, GERD may create small holes (ulcers) in the lining of the esophagus. CAUSES   Increased body weight. This puts pressure on the stomach, making acid rise from the stomach into the esophagus.  Smoking. This increases acid production in the stomach.  Drinking alcohol. This causes decreased pressure in the lower esophageal sphincter (valve or ring of muscle between the esophagus and stomach), allowing acid from the stomach into the esophagus.  Late evening meals and a full stomach. This increases pressure and acid production in the stomach.  A malformed lower esophageal sphincter. Sometimes, no cause is found. SYMPTOMS   Burning pain in the lower part of the  mid-chest behind the breastbone and in the mid-stomach area. This may occur twice a week or more often.  Trouble swallowing.  Sore throat.  Dry cough.  Asthma-like symptoms including chest tightness, shortness of breath, or wheezing. DIAGNOSIS  Your caregiver may be able to diagnose GERD based on your symptoms. In some cases, X-rays and other tests may be done to check for complications or to check the condition of your stomach and esophagus. TREATMENT  Your caregiver may recommend over-the-counter or prescription medicines to help decrease acid production. Ask your caregiver before starting or adding any new medicines.  HOME CARE INSTRUCTIONS   Change the factors that you can control. Ask your caregiver for guidance concerning weight loss, quitting smoking, and alcohol consumption.  Avoid foods and drinks that make your symptoms worse, such as:  Caffeine or alcoholic drinks.  Chocolate.  Peppermint or mint flavorings.  Garlic and onions.  Spicy foods.  Citrus fruits, such as oranges, lemons, or limes.  Tomato-based foods such as sauce, chili, salsa, and pizza.  Fried and fatty foods.  Avoid lying down for the 3 hours prior to your bedtime or prior to taking a nap.  Eat small, frequent meals instead of large meals.  Wear loose-fitting clothing. Do not wear anything tight around your waist that causes pressure on your stomach.  Raise the head of your bed 6 to 8 inches with wood blocks to help you sleep. Extra pillows will not help.  Only take over-the-counter or prescription medicines for pain, discomfort, or fever as directed by your caregiver.  Do not take aspirin, ibuprofen, or other nonsteroidal anti-inflammatory drugs (NSAIDs). SEEK IMMEDIATE MEDICAL CARE IF:   You have pain in your arms, neck, jaw, teeth, or back.  Your pain increases or changes in intensity or duration.  You develop nausea, vomiting, or sweating (diaphoresis).  You develop shortness of  breath, or you faint.  Your vomit is green, yellow, black, or looks like coffee grounds or blood.  Your stool is red, bloody, or black. These symptoms could be signs of other problems, such as heart disease, gastric bleeding, or esophageal bleeding. MAKE SURE YOU:   Understand these instructions.  Will watch your condition.  Will get help right away if you are not doing well or get worse. Document Released: 05/02/2005 Document Revised: 10/15/2011 Document Reviewed: 02/09/2011 [facility] Patient Information 2015 [place], [place]. This information is not intended to replace advice given to you by your health care provider. Make sure you discuss any questions you have with your health care provider.

## 2014-08-24 ENCOUNTER — Ambulatory Visit: Payer: BC Managed Care – PPO

## 2014-08-24 ENCOUNTER — Other Ambulatory Visit: Payer: Self-pay | Admitting: Family Medicine

## 2014-08-24 DIAGNOSIS — IMO0002 Reserved for concepts with insufficient information to code with codable children: Secondary | ICD-10-CM

## 2014-08-24 DIAGNOSIS — E1165 Type 2 diabetes mellitus with hyperglycemia: Secondary | ICD-10-CM

## 2014-08-24 DIAGNOSIS — D509 Iron deficiency anemia, unspecified: Secondary | ICD-10-CM

## 2014-08-24 LAB — IRON AND TIBC
%SAT: 6 % — ABNORMAL LOW (ref 20–55)
Iron: 29 ug/dL — ABNORMAL LOW (ref 42–165)
TIBC: 470 ug/dL — ABNORMAL HIGH (ref 215–435)
UIBC: 441 ug/dL — AB (ref 125–400)

## 2014-08-24 LAB — PSA: PSA: 2.51 ng/mL (ref <=4.00)

## 2014-08-24 LAB — FERRITIN: FERRITIN: 8 ng/mL — AB (ref 22–322)

## 2014-08-24 MED ORDER — SITAGLIPTIN PHOSPHATE 100 MG PO TABS: 100.0000 mg | ORAL_TABLET | Freq: Every day | ORAL | Status: DC

## 2014-08-26 ENCOUNTER — Other Ambulatory Visit: Payer: Self-pay | Admitting: Family Medicine

## 2014-08-26 DIAGNOSIS — D509 Iron deficiency anemia, unspecified: Secondary | ICD-10-CM

## 2014-08-26 MED ORDER — FERROUS SULFATE 325 (65 FE) MG PO TABS: 325.0000 mg | ORAL_TABLET | Freq: Two times a day (BID) | ORAL | Status: DC

## 2014-08-31 ENCOUNTER — Ambulatory Visit (INDEPENDENT_AMBULATORY_CARE_PROVIDER_SITE_OTHER): Payer: BLUE CROSS/BLUE SHIELD

## 2014-08-31 VITALS — BP 120/74 | HR 78 | Resp 12

## 2014-08-31 DIAGNOSIS — E1140 Type 2 diabetes mellitus with diabetic neuropathy, unspecified: Secondary | ICD-10-CM

## 2014-08-31 DIAGNOSIS — L603 Nail dystrophy: Secondary | ICD-10-CM

## 2014-08-31 DIAGNOSIS — B351 Tinea unguium: Secondary | ICD-10-CM

## 2014-08-31 NOTE — Progress Notes (Signed)
   Subjective:    Patient ID: Janann Augustercy R Mckissic, male    DOB: [birth year], 54 y.o.   MRN: [id number]  HPI  PT STATED LT FOOT GREAT TOENAIL STILL HAVE DISCOLORATION AND IS NOT GETTING ANY BETTER. TRIED FORMULA 3 BUT NO HELP.  Review of Systems  All other systems reviewed and are negative.      Objective:   Physical Exam Neurovascular status is intact and unchanged pedal pulses are palpable DP and PT +2. The remaining nails are normal however the hallux left still show some very white superficial striations consistent with either an eczematous nail osseous psoriatic or dry nailbed cannot rule out history of trauma to the nail do not feel there is any residual fungal infection at this time appears to be cleared there is no subungual buildup no darkening and discoloration just some superficial white excoriations in the nail remainder the exam unremarkable pedal pulses are palpable       Assessment & Plan:  Assessment resolved onychomycosis of nails however there may be some residual scarring in the nail plate and nailbed or possibly eczematous and dry nail changes recommended topical antifungal such as Fungi-Nail or formula 3 once or twice a month as prevention in the future. Patient is discharged to an as-needed basis for follow-up  Alvan Dameichard Blannie Shedlock DPM

## 2014-08-31 NOTE — Patient Instructions (Signed)
Onychomycosis/Fungal Toenails  WHAT IS IT? An infection that lies within the keratin of your nail plate that is caused by a fungus.  WHY ME? Fungal infections affect all ages, sexes, races, and creeds.  There may be many factors that predispose you to a fungal infection such as age, coexisting medical conditions such as diabetes, or an autoimmune disease; stress, medications, fatigue, genetics, etc.  Bottom line: fungus thrives in a warm, moist environment and your shoes offer such a location.  IS IT CONTAGIOUS? Theoretically, yes.  You do not want to share shoes, nail clippers or files with someone who has fungal toenails.  Walking around barefoot in the same room or sleeping in the same bed is unlikely to transfer the organism.  It is important to realize, however, that fungus can spread easily from one nail to the next on the same foot.  HOW DO WE TREAT THIS?  There are several ways to treat this condition.  Treatment may depend on many factors such as age, medications, pregnancy, liver and kidney conditions, etc.  It is best to ask your doctor which options are available to you.  1. No treatment.   Unlike many other medical concerns, you can live with this condition.  However for many people this can be a painful condition and may lead to ingrown toenails or a bacterial infection.  It is recommended that you keep the nails cut short to help reduce the amount of fungal nail. 2. Topical treatment.  These range from herbal remedies to prescription strength nail lacquers.  About 40-50% effective, topicals require twice daily application for approximately 9 to 12 months or until an entirely new nail has grown out.  The most effective topicals are medical grade medications available through physicians offices. 3. Oral antifungal medications.  With an 80-90% cure rate, the most common oral medication requires 3 to 4 months of therapy and stays in your system for a year as the new nail grows out.  Oral  antifungal medications do require blood work to make sure it is a safe drug for you.  A liver function panel will be performed prior to starting the medication and after the first month of treatment.  It is important to have the blood work performed to avoid any harmful side effects.  In general, this medication safe but blood work is required. 4. Laser Therapy.  This treatment is performed by applying a specialized laser to the affected nail plate.  This therapy is noninvasive, fast, and non-painful.  It is not covered by insurance and is therefore, out of pocket.  The results have been very good with a 80-95% cure rate.  [facility] is the only practice in the area to offer this therapy. 5. Permanent Nail Avulsion.  Removing the entire nail so that a new nail will not grow back.     Recommendations for long-term prevention of recurrence of fungal infections is to apply topical antifungal such as Fungi-Nail which can be obtained at any drugstore without prescription for topical formula 3. Apply once or twice a month as preventative

## 2014-09-10 LAB — POC HEMOCCULT BLD/STL (HOME/3-CARD/SCREEN)
Card #3 Fecal Occult Blood, POC: NEGATIVE
FECAL OCCULT BLD: NEGATIVE
FECAL OCCULT BLD: NEGATIVE

## 2014-09-10 NOTE — Addendum Note (Signed)
Addended byAlden Benjamin: Allana Shrestha R on: 09/10/2014 11:23 AM   Modules accepted: Orders

## 2014-09-10 NOTE — Addendum Note (Signed)
Addended byAlden Benjamin: Breezie Micucci R on: 09/10/2014 11:24 AM   Modules accepted: Orders

## 2014-09-15 ENCOUNTER — Other Ambulatory Visit: Payer: Self-pay | Admitting: Family Medicine

## 2014-09-17 ENCOUNTER — Ambulatory Visit (INDEPENDENT_AMBULATORY_CARE_PROVIDER_SITE_OTHER): Payer: BLUE CROSS/BLUE SHIELD | Admitting: Nurse Practitioner

## 2014-09-17 ENCOUNTER — Encounter: Payer: Self-pay | Admitting: Nurse Practitioner

## 2014-09-17 VITALS — BP 110/80 | HR 80 | Ht 66.5 in | Wt 212.0 lb

## 2014-09-17 DIAGNOSIS — D509 Iron deficiency anemia, unspecified: Secondary | ICD-10-CM

## 2014-09-17 NOTE — Progress Notes (Signed)
HPI :  Patient is a 54 year old male know to Dr. Christella HartiganJacobs from previous colonoscopy. He is referred by PCP for evaluation of anemia.  Hgb 12.3, mcv 69. Patient has no overt GI bleeding or other GI symptoms such as bowel changes, abdominal pain, unexpected weight loss. No NSAID use.    Past Medical History  Diagnosis Date  . Diabetes mellitus without complication   . Hyperlipidemia     Family History  Problem Relation Age of Onset  . Diabetes Mother   . Kidney disease Mother   . Diabetes Father   . Hypertension Father   . Mental illness Father [id number]    Alzheimer's dementia  . Diabetes Brother   . Stroke Brother   . Early death Brother   . Diabetes Maternal Grandfather   . Diabetes Paternal Grandmother    History  Substance Use Topics  . Smoking status: Never Smoker   . Smokeless tobacco: Never Used  . Alcohol Use: No   Current Outpatient Prescriptions  Medication Sig Dispense Refill  . ferrous sulfate 325 (65 FE) MG tablet Take 1 tablet (325 mg total) by mouth 2 (two) times daily with a meal. 60 tablet 3  . glucose blood test strip Use as instructed 100 each 12  . lisinopril (PRINIVIL,ZESTRIL) 5 MG tablet Take 1 tablet (5 mg total) by mouth daily. 30 tablet 5  . metFORMIN (GLUCOPHAGE) 1000 MG tablet Take 1 tablet (1,000 mg total) by mouth 2 (two) times daily with a meal. 60 tablet 5  . simvastatin (ZOCOR) 40 MG tablet TAKE ONE TABLET BY MOUTH EVERY DAY IN THE EVENING 90 tablet 3  . sitaGLIPtin (JANUVIA) 100 MG tablet Take 1 tablet (100 mg total) by mouth daily. 30 tablet 2  . zolpidem (AMBIEN) 10 MG tablet Take 1/2 - 1 tablet at bedtime as needed for sleep. 20 tablet 0  . ranitidine (ZANTAC) 150 MG/10ML syrup Take by mouth 2 (two) times daily. 300 mL 0   No current facility-administered medications for this visit.   Allergies  Allergen Reactions  . Penicillins Rash    Swell of eyes     Review of Systems: All systems reviewed and negative except where noted in HPI.     Physical Exam: BP 110/80 mmHg  Pulse 80  Ht 5' 6.5" (1.689 m)  Wt 212 lb (96.163 kg)  BMI 33.71 kg/m2 Constitutional: Pleasant,well-developed, black male in no acute distress. HEENT: Normocephalic and atraumatic. Conjunctivae are normal. No scleral icterus. Neck supple.  Cardiovascular: Normal rate, regular rhythm.  Pulmonary/chest: Effort normal and breath sounds normal. No wheezing, rales or rhonchi. Abdominal: Soft, nondistended, nontender. Bowel sounds active throughout. There are no masses palpable. No hepatomegaly. Rectal: heme negative stool Extremities: no edema Lymphadenopathy: No cervical adenopathy noted. Neurological: Alert and oriented to person place and time. Skin: Skin is warm and dry. No rashes noted. Psychiatric: Normal mood and affect. Behavior is normal.   ASSESSMENT AND PLAN:   54 year old male referred for Microcytic anemia (hgb 12). Patient had a complete colonoscopy for heme positive stools in March 2010 with findings of only hemorrhoids. IFOB in 2013 and another Feb 2015 were negative. Patient has no overt bleeding, he is heme negative today. No bowel changes, unusual weight loss or other concerning features. Suspect iron deficiency anemia related to frequent donation of blood. He has been donating blood every few months for years. I asked him to hold off on donating for now and to continue oral  iron which was recently started by PCP. Would ask that PCP monitor CBC. If anemia persists, or if patient develops any GI symptoms then would be happy to see back.

## 2014-09-17 NOTE — Patient Instructions (Signed)
Please continue taking Ranitidine and Iron prescribed by your PCP  Please hold off on donating any blood  Please return to your PCP for monthly lab work to check your CBC  Please follow up as needed  VW:[id number]:Chris Guest

## 2014-09-20 ENCOUNTER — Encounter: Payer: Self-pay | Admitting: Nurse Practitioner

## 2014-09-21 DIAGNOSIS — D509 Iron deficiency anemia, unspecified: Secondary | ICD-10-CM | POA: Insufficient documentation

## 2014-09-21 NOTE — Progress Notes (Signed)
I agree with the above note, plan 

## 2014-09-29 ENCOUNTER — Other Ambulatory Visit: Payer: Self-pay | Admitting: Family Medicine

## 2014-10-21 ENCOUNTER — Other Ambulatory Visit: Payer: Self-pay | Admitting: Physician Assistant

## 2014-10-27 ENCOUNTER — Other Ambulatory Visit: Payer: Self-pay | Admitting: Physician Assistant

## 2014-10-28 ENCOUNTER — Telehealth: Payer: Self-pay

## 2014-10-28 NOTE — Telephone Encounter (Signed)
Yes, these are rare but possibly serious side effect of the med. Not positive that his symptoms are from the med but its possible He should stop the medication now. If he is having any fevers, blistering, skin tenderness, or painful lesions in his mouth he needs to come to clinic or go to the ER right away. Otherwise he should schedule to f/u with Debbie asap so she can start a different medication.

## 2014-10-28 NOTE — Telephone Encounter (Signed)
Is this a SE? Not sure if Carlos Matthews is checking her messages this week. Please advise. Thanks

## 2014-10-28 NOTE — Telephone Encounter (Signed)
Patient has been experiencing joint pain and a rash, he feels that it is a side effect of Januvia and wants to talk with Leone PayorGessner about this.

## 2014-10-29 NOTE — Telephone Encounter (Signed)
Gave pt message. He has stopped med and will make appt with debbie to start new med.

## 2014-11-02 ENCOUNTER — Ambulatory Visit: Payer: Self-pay | Admitting: Family Medicine

## 2014-11-08 ENCOUNTER — Ambulatory Visit: Payer: Self-pay | Admitting: Gastroenterology

## 2014-11-14 ENCOUNTER — Other Ambulatory Visit: Payer: Self-pay | Admitting: Family Medicine

## 2014-11-17 ENCOUNTER — Telehealth: Payer: Self-pay

## 2014-11-17 NOTE — Telephone Encounter (Signed)
Patient wants to know if his pharmacy sent a request about his Januvia.   [phone]

## 2014-11-18 NOTE — Telephone Encounter (Signed)
Left voicemail. Pt stated that this medication caused him joint pains. Left message for pt to call back.

## 2014-11-26 ENCOUNTER — Other Ambulatory Visit: Payer: Self-pay | Admitting: Physician Assistant

## 2014-11-29 ENCOUNTER — Ambulatory Visit: Payer: Self-pay | Admitting: Family Medicine

## 2014-11-29 ENCOUNTER — Ambulatory Visit: Payer: BLUE CROSS/BLUE SHIELD | Admitting: Family Medicine

## 2014-12-06 ENCOUNTER — Encounter: Payer: Self-pay | Admitting: Family Medicine

## 2014-12-06 ENCOUNTER — Ambulatory Visit (INDEPENDENT_AMBULATORY_CARE_PROVIDER_SITE_OTHER): Payer: BLUE CROSS/BLUE SHIELD | Admitting: Family Medicine

## 2014-12-06 VITALS — BP 127/70 | HR 67 | Temp 98.1°F | Resp 16 | Ht 66.0 in | Wt 206.8 lb

## 2014-12-06 DIAGNOSIS — E119 Type 2 diabetes mellitus without complications: Secondary | ICD-10-CM

## 2014-12-06 DIAGNOSIS — D509 Iron deficiency anemia, unspecified: Secondary | ICD-10-CM | POA: Diagnosis not present

## 2014-12-06 LAB — CBC
HCT: 45.1 % (ref 39.0–52.0)
HEMOGLOBIN: 14.3 g/dL (ref 13.0–17.0)
MCH: 25 pg — ABNORMAL LOW (ref 26.0–34.0)
MCHC: 31.7 g/dL (ref 30.0–36.0)
MCV: 79 fL (ref 78.0–100.0)
PLATELETS: 162 10*3/uL (ref 150–400)
RBC: 5.71 MIL/uL (ref 4.22–5.81)
RDW: 18.2 % — AB (ref 11.5–15.5)
WBC: 4 10*3/uL (ref 4.0–10.5)

## 2014-12-06 LAB — POCT GLYCOSYLATED HEMOGLOBIN (HGB A1C): Hemoglobin A1C: 8.9

## 2014-12-06 NOTE — Patient Instructions (Signed)
Please decrease your carbohydrate intake- read labels Net carbohydrates is total carbs - fiber grams Aim for less than 20 grams of carbohydrates per meal

## 2014-12-06 NOTE — Progress Notes (Signed)
   Subjective:    Patient ID: Carlos Matthews, male    DOB: 1960/08/24, 54 y.o.   MRN: [id number]  HPI Patient presents today for follow up of his diabetes. He is checking his blood sugar, but doesn't feel like the [place] is making a difference. His blood sugars have been ranging from 87 to 270. He is "watching his diet" and is trying to get more exercise (riding stationary bike). He went to nutrition counseling a number of years ago and is open to returning for a "refresher." He admits to eating a lot of whole wheat bread, often eating a sandwich for breakfast and lunch. He often has Cheetos with his lunch.   He was seen by GI anemia and the suspected cause was frequent blood donation. Patient has not donated blood since that time and we will recheck today.   Medications, allergies, past medical history, surgical history, family history, social history and problem list reviewed and updated.  Review of Systems No chest pain, no SOB, no dizziness/lightheadedness, no edema.    Objective:   Physical Exam  Constitutional: He is oriented to person, place, and time. He appears well-developed and well-nourished.  Obese   HENT:  Head: Normocephalic and atraumatic.  Eyes: Conjunctivae are normal.  Neck: Normal range of motion. Neck supple.  Cardiovascular: Normal rate, regular rhythm and normal heart sounds.   Pulmonary/Chest: Effort normal and breath sounds normal.  Musculoskeletal: Normal range of motion. He exhibits no edema.  Neurological: He is alert and oriented to person, place, and time.  Skin: Skin is warm and dry.  Psychiatric: He has a normal mood and affect. His behavior is normal. Judgment and thought content normal.  Vitals reviewed.  BP 127/70 mmHg  Pulse 67  Temp(Src) 98.1 F (36.7 C) (Oral)  Resp 16  Ht 5\' 6"  (1.676 m)  Wt 206 lb 12.8 oz (93.804 kg)  BMI 33.39 kg/m2  SpO2 97% HgbA1C- 8.9 (from 9.9 08/23/14) Diabetic Foot Exam - Simple   Simple Foot Form  Diabetic  Foot exam was performed with the following findings:  Yes 12/06/2014  9:14 AM  Visual Inspection  Sensation Testing  Pulse Check  Comments        Assessment & Plan:  1. Type 2 diabetes mellitus not at goal - [facility] Diabetes Foot Exam - POCT glycosylated hemoglobin (Hb A1C) - amb referral to nutrition - Will continue with Januvia since HbA1C has improved. Discussed some possible changes to his diet while he is waiting to see nutrition and encouraged him to exercise at least every other day and work on 1/2-1 pound weight loss per week.  - Follow up in 3 months  2. Iron deficiency anemia - CBC   Olean Reeeborah Efrata Brunner, FNP-BC  [facility]  12/08/2014 10:19 PM

## 2014-12-08 ENCOUNTER — Encounter: Payer: Self-pay | Admitting: Family Medicine

## 2014-12-10 ENCOUNTER — Telehealth: Payer: Self-pay | Admitting: Family Medicine

## 2014-12-10 NOTE — Telephone Encounter (Signed)
Patient rather come in on 7/18 to see Carlos Matthews because he says something is coming up in August and dont remember what it is so hell keep his appt with Dr Carlos Matthews

## 2014-12-10 NOTE — Telephone Encounter (Signed)
-----   Message from Emi Belfasteborah B Gessner, FNP sent at 12/09/2014 10:29 PM EDT ----- I noticed that Mr. Carlos Matthews has an appointment scheduled with Dr. Katrinka BlazingSmith in July and then one with me in August. He doesn't need both. It would be better to come in August so we can check his HgbA1C. It is fine if he wants to see Dr. Katrinka BlazingSmith if she has an appointment available in August or he can keep his appointment with me. Thanks, Ameren CorporationDebbie

## 2015-01-11 ENCOUNTER — Other Ambulatory Visit: Payer: Self-pay | Admitting: Family Medicine

## 2015-01-13 ENCOUNTER — Other Ambulatory Visit: Payer: Self-pay | Admitting: Family Medicine

## 2015-01-17 ENCOUNTER — Ambulatory Visit: Payer: BLUE CROSS/BLUE SHIELD | Admitting: Dietician

## 2015-02-18 ENCOUNTER — Other Ambulatory Visit: Payer: Self-pay | Admitting: Family Medicine

## 2015-02-21 ENCOUNTER — Ambulatory Visit (INDEPENDENT_AMBULATORY_CARE_PROVIDER_SITE_OTHER): Payer: BLUE CROSS/BLUE SHIELD

## 2015-02-21 ENCOUNTER — Ambulatory Visit (INDEPENDENT_AMBULATORY_CARE_PROVIDER_SITE_OTHER): Payer: BLUE CROSS/BLUE SHIELD | Admitting: Family Medicine

## 2015-02-21 ENCOUNTER — Encounter: Payer: Self-pay | Admitting: Family Medicine

## 2015-02-21 VITALS — BP 118/82 | HR 76 | Temp 97.9°F | Resp 16 | Ht 66.0 in | Wt 205.6 lb

## 2015-02-21 DIAGNOSIS — G4700 Insomnia, unspecified: Secondary | ICD-10-CM

## 2015-02-21 DIAGNOSIS — E119 Type 2 diabetes mellitus without complications: Secondary | ICD-10-CM

## 2015-02-21 DIAGNOSIS — M5030 Other cervical disc degeneration, unspecified cervical region: Secondary | ICD-10-CM

## 2015-02-21 DIAGNOSIS — D509 Iron deficiency anemia, unspecified: Secondary | ICD-10-CM

## 2015-02-21 DIAGNOSIS — E785 Hyperlipidemia, unspecified: Secondary | ICD-10-CM

## 2015-02-21 LAB — CBC WITH DIFFERENTIAL/PLATELET
Basophils Absolute: 0 10*3/uL (ref 0.0–0.1)
Basophils Relative: 1 % (ref 0–1)
EOS PCT: 6 % — AB (ref 0–5)
Eosinophils Absolute: 0.2 10*3/uL (ref 0.0–0.7)
HEMATOCRIT: 44.5 % (ref 39.0–52.0)
Hemoglobin: 14.5 g/dL (ref 13.0–17.0)
Lymphocytes Relative: 39 % (ref 12–46)
Lymphs Abs: 1.6 10*3/uL (ref 0.7–4.0)
MCH: 26.7 pg (ref 26.0–34.0)
MCHC: 32.6 g/dL (ref 30.0–36.0)
MCV: 82 fL (ref 78.0–100.0)
MPV: 10.7 fL (ref 8.6–12.4)
Monocytes Absolute: 0.4 10*3/uL (ref 0.1–1.0)
Monocytes Relative: 9 % (ref 3–12)
NEUTROS PCT: 45 % (ref 43–77)
Neutro Abs: 1.8 10*3/uL (ref 1.7–7.7)
PLATELETS: 160 10*3/uL (ref 150–400)
RBC: 5.43 MIL/uL (ref 4.22–5.81)
RDW: 14.9 % (ref 11.5–15.5)
WBC: 4 10*3/uL (ref 4.0–10.5)

## 2015-02-21 LAB — COMPREHENSIVE METABOLIC PANEL
ALBUMIN: 4.2 g/dL (ref 3.5–5.2)
ALT: 18 U/L (ref 0–53)
AST: 17 U/L (ref 0–37)
Alkaline Phosphatase: 41 U/L (ref 39–117)
BILIRUBIN TOTAL: 0.4 mg/dL (ref 0.2–1.2)
BUN: 17 mg/dL (ref 6–23)
CO2: 24 mEq/L (ref 19–32)
CREATININE: 1.08 mg/dL (ref 0.50–1.35)
Calcium: 9.4 mg/dL (ref 8.4–10.5)
Chloride: 105 mEq/L (ref 96–112)
Glucose, Bld: 185 mg/dL — ABNORMAL HIGH (ref 70–99)
POTASSIUM: 4.4 meq/L (ref 3.5–5.3)
Sodium: 139 mEq/L (ref 135–145)
Total Protein: 7 g/dL (ref 6.0–8.3)

## 2015-02-21 LAB — LIPID PANEL
CHOLESTEROL: 151 mg/dL (ref 0–200)
HDL: 50 mg/dL (ref >=40)
LDL Cholesterol: 85 mg/dL (ref 0–99)
Total CHOL/HDL Ratio: 3 Ratio
Triglycerides: 79 mg/dL (ref <150)
VLDL: 16 mg/dL (ref 0–40)

## 2015-02-21 MED ORDER — LISINOPRIL 5 MG PO TABS: 5.0000 mg | ORAL_TABLET | Freq: Every day | ORAL | Status: DC

## 2015-02-21 MED ORDER — SIMVASTATIN 40 MG PO TABS: 40.0000 mg | ORAL_TABLET | Freq: Every day | ORAL | Status: DC

## 2015-02-21 MED ORDER — ZOLPIDEM TARTRATE 10 MG PO TABS: ORAL_TABLET | ORAL | Status: DC

## 2015-02-21 MED ORDER — METFORMIN HCL 1000 MG PO TABS: 1000.0000 mg | ORAL_TABLET | Freq: Two times a day (BID) | ORAL | Status: DC

## 2015-02-21 MED ORDER — SITAGLIPTIN PHOSPHATE 100 MG PO TABS: 100.0000 mg | ORAL_TABLET | Freq: Every day | ORAL | Status: DC

## 2015-02-21 MED ORDER — MELOXICAM 15 MG PO TABS: 15.0000 mg | ORAL_TABLET | Freq: Every day | ORAL | Status: DC

## 2015-02-21 MED ORDER — METHOCARBAMOL 500 MG PO TABS: 500.0000 mg | ORAL_TABLET | Freq: Every evening | ORAL | Status: DC | PRN

## 2015-02-21 NOTE — Progress Notes (Signed)
Subjective:    Patient ID: Carlos Matthews, male    DOB: May 14, 1961, 54 y.o.   MRN: [id number]  02/21/2015  Follow-up; Diabetes; Medication Refill; and Hypertension   HPI This 54 y.o. male presents for two month follow-up of the following:  1. DMII:  Patient reports good compliance with medication, good tolerance to medication, and good symptom control.   Has decreased breads and potatoes.  Checks sugars once daily; sugars running 86 once; 110 this morning.  Much better.  Metformin  bid; Januvia  every morning.  Stopped Glipizide when started Januvia.  Last eye exam due; 1.5 years ago; cannot recall ophthalmologist.  Nocturia x 0.  No n/t/w/burning in legs.  Diagnosed with DMII since 1990s.  2.  Iron deficiency anemia: last Hgb of 14.3 in May 2016.  Taking iron supplement once daily if takes it; usually takes four days per week.  Last colonoscopy 2010; repeat was recommended not sure; repeat in 10 years; no polyps identified.  3.  Hypertension:  Taking Lisinopril for renal protection.  Exercising 4 days per week; biking and walking.   4. Hyperlipidemia:  Patient reports good compliance with medication, good tolerance to medication, and good symptom control.  Fasting today.  5. Insomnia:  Taking Ambien  qhs PRN; does not take nightly.  Due for refill.  Cuts in 1/2.  6. GERD:  Taking Ranitidine once daily.    7.  DDD cervical spine: takes Excedrin for chronic neck pain.  S/p fusion cervical by Aliene Beams in 2005.  No repeat xrays.  Would like xray. L radicular pain.  Pain with rotatioin side to side; pain for years.  Also suffers with sciatica pain.  Advised that may need repeat surgery later down the road.     Review of Systems  Constitutional: Negative for fever, chills, diaphoresis, activity change, appetite change and fatigue.  Respiratory: Negative for cough and shortness of breath.   Cardiovascular: Negative for chest pain, palpitations and leg swelling.    Gastrointestinal: Negative for nausea, vomiting, abdominal pain and diarrhea.  Endocrine: Negative for cold intolerance, heat intolerance, polydipsia, polyphagia and polyuria.  Musculoskeletal: Positive for neck pain and neck stiffness.  Skin: Negative for color change, rash and wound.  Neurological: Negative for dizziness, tremors, seizures, syncope, facial asymmetry, speech difficulty, weakness, light-headedness, numbness and headaches.  Psychiatric/Behavioral: Negative for sleep disturbance and dysphoric mood. The patient is not nervous/anxious.     Past Medical History  Diagnosis Date  . Diabetes mellitus without complication   . Hyperlipidemia   . GERD (gastroesophageal reflux disease)    Past Surgical History  Procedure Laterality Date  . Tonsillectomy    . Neck surgery    . Rotator cuff repair    . Arm fracture     . Vasectomy    . Fracture surgery     Allergies  Allergen Reactions  . Penicillins Rash    Swell of eyes   Current Outpatient Prescriptions  Medication Sig Dispense Refill  . ferrous sulfate 325 (65 FE) MG tablet Take 1 tablet (325 mg total) by mouth 2 (two) times daily with a meal. 60 tablet 3  . glucose blood test strip Use as instructed 100 each 12  . lisinopril (PRINIVIL,ZESTRIL) 5 MG tablet Take 1 tablet (5 mg total) by mouth daily. 90 tablet 1  . metFORMIN (GLUCOPHAGE) 1000 MG tablet Take 1 tablet (1,000 mg total) by mouth 2 (two) times daily with a meal. 180 tablet 1  . ranitidine (ZANTAC)  150 MG/10ML syrup Take by mouth 2 (two) times daily. 300 mL 0  . simvastatin (ZOCOR) 40 MG tablet Take 1 tablet (40 mg total) by mouth daily. PATIENT NEEDS OFFICE VISIT/FASTING LABS FOR ADDITIONAL REFILLS 90 tablet 1  . sitaGLIPtin (JANUVIA) 100 MG tablet Take 1 tablet (100 mg total) by mouth daily. 90 tablet 1  . zolpidem (AMBIEN) 10 MG tablet Take 1/2 - 1 tablet at bedtime as needed for sleep. 30 tablet 2  . glipiZIDE (GLUCOTROL XL) 2.5 MG 24 hr tablet TAKE TWO  TABLETS BY MOUTH ONCE DAILY WITH BREAKFAST 60 tablet 2  . meloxicam (MOBIC) 15 MG tablet Take 1 tablet (15 mg total) by mouth daily. 30 tablet 0  . methocarbamol (ROBAXIN) 500 MG tablet Take 1-2 tablets (500-1,000 mg total) by mouth at bedtime as needed for muscle spasms. 60 tablet 2   No current facility-administered medications for this visit.   Social History   Social History  . Marital Status: Single    Spouse Name: N/A  . Number of Children: N/A  . Years of Education: N/A   Occupational History  . Not on file.   Social History Main Topics  . Smoking status: Never Smoker   . Smokeless tobacco: Never Used  . Alcohol Use: No  . Drug Use: No  . Sexual Activity: Not on file   Other Topics Concern  . Not on file   Social History Narrative   Marital status:  Married x 27 years      Children: 4 children; 3 grandchildren      Employment:  Conservation officer, [employer] Truck driver x 5 years; truck driver x 36 years      Tobacco: none      Alcohol: none      Exercise: 4 days per week         Family History  Problem Relation Age of Onset  . Diabetes Mother   . Kidney disease Mother   . Diabetes Father   . Hypertension Father   . Mental illness Father 1473    Alzheimer's dementia  . Diabetes Brother   . Stroke Brother   . Early death Brother   . Diabetes Maternal Grandfather   . Diabetes Paternal Grandmother   . Diabetes Sister   . Lupus Sister         Objective:    BP 118/82 mmHg  Pulse 76  Temp(Src) 97.9 F (36.6 C) (Oral)  Resp 16  Ht 5\' 6"  (1.676 m)  Wt 205 lb 9.6 oz (93.26 kg)  BMI 33.20 kg/m2  SpO2 97% Physical Exam  Constitutional: He is oriented to person, place, and time. He appears well-developed and well-nourished. No distress.  HENT:  Head: Normocephalic and atraumatic.  Right Ear: External ear normal.  Left Ear: External ear normal.  Nose: Nose normal.  Mouth/Throat: Oropharynx is clear and moist.  Eyes: Conjunctivae and EOM are normal. Pupils are equal, round,  and reactive to light.  Neck: Normal range of motion. Neck supple. Carotid bruit is not present. No thyromegaly present.  Cardiovascular: Normal rate, regular rhythm, normal heart sounds and intact distal pulses.  Exam reveals no gallop and no friction rub.   No murmur heard. Pulmonary/Chest: Effort normal and breath sounds normal. He has no wheezes. He has no rales.  Abdominal: Soft. Bowel sounds are normal. He exhibits no distension and no mass. There is no tenderness. There is no rebound and no guarding.  Musculoskeletal:       Right shoulder:  Normal. He exhibits normal range of motion and no tenderness.       Left shoulder: Normal. He exhibits normal range of motion and no tenderness.       Cervical back: He exhibits tenderness and pain. He exhibits normal range of motion, no bony tenderness, no spasm and normal pulse.  Lymphadenopathy:    He has no cervical adenopathy.  Neurological: He is alert and oriented to person, place, and time. No cranial nerve deficit.  Skin: Skin is warm and dry. No rash noted. He is not diaphoretic.  Psychiatric: He has a normal mood and affect. His behavior is normal.  Nursing note and vitals reviewed.   [facility] reading (PRIMARY) by  Dr. Katrinka Blazing.  CERVICAL SPINE FILMS: STABLE FUSION; MILD DEGENERATIVE CHANGES; NO ACUTE PROCESS.      Assessment & Plan:   1. Degenerative cervical disc   2. Type 2 diabetes mellitus not at goal   3. Hyperlipidemia   4. Insomnia   5. Anemia, iron deficiency     1. DDD cervical spine: chronic; suffers with daily pain; rx for Meloxicam and Robaxin provided. Home exercise program recommended.  If pain persists, refer back to ortho. 2.  DMII: uncontrolled two months ago but recent readings have improved; continue Metformin and Januvia; refills provided.  Continue Lisinopril for renal protection. 3.  Hyperlipidemia: controlled; obtain labs; refills provided. 4. Insomnia: controlled; refill of Ambien provided. 5. Iron deficiency  anemia: stable; repeat labs; colonoscopy UTD.  Meds ordered this encounter  Medications  . meloxicam (MOBIC) 15 MG tablet    Sig: Take 1 tablet (15 mg total) by mouth daily.    Dispense:  30 tablet    Refill:  0  . methocarbamol (ROBAXIN) 500 MG tablet    Sig: Take 1-2 tablets (500-1,000 mg total) by mouth at bedtime as needed for muscle spasms.    Dispense:  60 tablet    Refill:  2  . sitaGLIPtin (JANUVIA) 100 MG tablet    Sig: Take 1 tablet (100 mg total) by mouth daily.    Dispense:  90 tablet    Refill:  1  . lisinopril (PRINIVIL,ZESTRIL) 5 MG tablet    Sig: Take 1 tablet (5 mg total) by mouth daily.    Dispense:  90 tablet    Refill:  1  . metFORMIN (GLUCOPHAGE) 1000 MG tablet    Sig: Take 1 tablet (1,000 mg total) by mouth 2 (two) times daily with a meal.    Dispense:  180 tablet    Refill:  1  . simvastatin (ZOCOR) 40 MG tablet    Sig: Take 1 tablet (40 mg total) by mouth daily. PATIENT NEEDS OFFICE VISIT/FASTING LABS FOR ADDITIONAL REFILLS    Dispense:  90 tablet    Refill:  1  . zolpidem (AMBIEN) 10 MG tablet    Sig: Take 1/2 - 1 tablet at bedtime as needed for sleep.    Dispense:  30 tablet    Refill:  2    Return in about 3 months (around 05/24/2015) for recheck with Deboraha Sprang.    Rosha Cocker Paulita Fujita, M.D. [facility] [address] [place], [place]  [address] [phone] phone [phone] fax

## 2015-02-21 NOTE — Patient Instructions (Signed)

## 2015-02-23 ENCOUNTER — Other Ambulatory Visit: Payer: Self-pay | Admitting: Physician Assistant

## 2015-02-25 ENCOUNTER — Telehealth: Payer: Self-pay | Admitting: Family Medicine

## 2015-02-25 NOTE — Telephone Encounter (Signed)
Patient calling about labs 

## 2015-02-27 NOTE — Telephone Encounter (Signed)
Lab results sent to lab pool to contact pt with results. 

## 2015-03-07 ENCOUNTER — Telehealth: Payer: Self-pay

## 2015-03-07 DIAGNOSIS — M5030 Other cervical disc degeneration, unspecified cervical region: Secondary | ICD-10-CM

## 2015-03-07 NOTE — Telephone Encounter (Signed)
Please review

## 2015-03-07 NOTE — Telephone Encounter (Signed)
Patient is calling because he would like to know more information about his x-ray that was done. Patient phone: [phone]

## 2015-03-08 NOTE — Telephone Encounter (Signed)
Please call patient.  Here are the results of his cervical spine films from his visit on 02/21/15: IMPRESSION: Prior anterior fusion C5-C6.  Minimal degenerative disc disease changes.  No acute abnormalities.  What questions does he have?  How is his neck pain?

## 2015-03-09 NOTE — Telephone Encounter (Signed)
Spoke with pt, he states his neck still hurts. He would like to know what the next step is?

## 2015-03-09 NOTE — Telephone Encounter (Signed)
Recommend evaluation by ortho.  Will place referral.  Please advise pt.

## 2015-03-10 NOTE — Telephone Encounter (Signed)
Pt.notified

## 2015-03-14 ENCOUNTER — Ambulatory Visit: Payer: BLUE CROSS/BLUE SHIELD | Admitting: Family Medicine

## 2015-04-26 ENCOUNTER — Other Ambulatory Visit: Payer: Self-pay | Admitting: Orthopedic Surgery

## 2015-04-26 DIAGNOSIS — M5416 Radiculopathy, lumbar region: Secondary | ICD-10-CM

## 2015-04-29 ENCOUNTER — Ambulatory Visit
Admission: RE | Admit: 2015-04-29 | Discharge: 2015-04-29 | Disposition: A | Discharge status: Home / Self Care | Payer: BLUE CROSS/BLUE SHIELD | Source: Ambulatory Visit | Attending: Orthopedic Surgery | Admitting: Orthopedic Surgery

## 2015-04-29 ENCOUNTER — Other Ambulatory Visit: Payer: Self-pay | Admitting: Orthopedic Surgery

## 2015-04-29 DIAGNOSIS — M542 Cervicalgia: Secondary | ICD-10-CM

## 2015-04-29 DIAGNOSIS — M5416 Radiculopathy, lumbar region: Secondary | ICD-10-CM

## 2015-05-04 ENCOUNTER — Other Ambulatory Visit: Payer: Self-pay | Admitting: Orthopedic Surgery

## 2015-05-23 ENCOUNTER — Inpatient Hospital Stay (HOSPITAL_COMMUNITY): Admission: RE | Admit: 2015-05-23 | Payer: BLUE CROSS/BLUE SHIELD | Source: Ambulatory Visit

## 2015-05-26 ENCOUNTER — Encounter (HOSPITAL_COMMUNITY): Admission: RE | Payer: Self-pay | Source: Ambulatory Visit

## 2015-05-26 ENCOUNTER — Ambulatory Visit (HOSPITAL_COMMUNITY)
Admission: RE | Admit: 2015-05-26 | Payer: BLUE CROSS/BLUE SHIELD | Source: Ambulatory Visit | Admitting: Orthopedic Surgery

## 2015-05-26 SURGERY — ANTERIOR CERVICAL DECOMPRESSION/DISCECTOMY FUSION 1 LEVEL
Anesthesia: General

## 2015-05-31 ENCOUNTER — Ambulatory Visit: Payer: Self-pay | Admitting: Family Medicine

## 2015-08-04 ENCOUNTER — Other Ambulatory Visit: Payer: Self-pay | Admitting: Family Medicine

## 2015-08-05 NOTE — Telephone Encounter (Signed)
Needs follow up appt with Debbie for additional refills.

## 2015-08-30 ENCOUNTER — Other Ambulatory Visit: Payer: Self-pay | Admitting: Family Medicine

## 2015-09-11 ENCOUNTER — Other Ambulatory Visit: Payer: Self-pay | Admitting: Family Medicine

## 2015-09-11 ENCOUNTER — Other Ambulatory Visit: Payer: Self-pay | Admitting: Physician Assistant

## 2015-09-19 ENCOUNTER — Other Ambulatory Visit: Payer: Self-pay | Admitting: Physician Assistant

## 2015-09-25 ENCOUNTER — Telehealth: Payer: Self-pay | Admitting: Radiology

## 2015-09-25 NOTE — Telephone Encounter (Signed)
Called patient / he states he dropped trailer on leg 3 weeks ago now painful again,was better for a while. He denies any pain and swelling of his calf, just pain over the area mid lower leg (front of lower leg) where the trailer landed on his leg. He agrees to come in to the office tomorrow, I have advised him not appropriate to treat injury over the phone, he must be seen. He agrees. He also states he was just calling us to see if we were open, he did not ask for a call back, and his situation is not urgent. To you FYI

## 2015-09-26 ENCOUNTER — Emergency Department (HOSPITAL_COMMUNITY): Payer: BLUE CROSS/BLUE SHIELD

## 2015-09-26 ENCOUNTER — Emergency Department (HOSPITAL_COMMUNITY)
Admission: EM | Admit: 2015-09-26 | Discharge: 2015-09-27 | Disposition: A | Discharge status: Home / Self Care | Payer: BLUE CROSS/BLUE SHIELD | Attending: Emergency Medicine | Admitting: Emergency Medicine

## 2015-09-26 ENCOUNTER — Encounter (HOSPITAL_COMMUNITY): Payer: Self-pay | Admitting: Emergency Medicine

## 2015-09-26 DIAGNOSIS — E119 Type 2 diabetes mellitus without complications: Secondary | ICD-10-CM | POA: Diagnosis not present

## 2015-09-26 DIAGNOSIS — E785 Hyperlipidemia, unspecified: Secondary | ICD-10-CM | POA: Diagnosis not present

## 2015-09-26 DIAGNOSIS — Y9389 Activity, other specified: Secondary | ICD-10-CM | POA: Insufficient documentation

## 2015-09-26 DIAGNOSIS — Z79899 Other long term (current) drug therapy: Secondary | ICD-10-CM | POA: Diagnosis not present

## 2015-09-26 DIAGNOSIS — Z7984 Long term (current) use of oral hypoglycemic drugs: Secondary | ICD-10-CM | POA: Diagnosis not present

## 2015-09-26 DIAGNOSIS — K219 Gastro-esophageal reflux disease without esophagitis: Secondary | ICD-10-CM | POA: Diagnosis not present

## 2015-09-26 DIAGNOSIS — Y998 Other external cause status: Secondary | ICD-10-CM | POA: Insufficient documentation

## 2015-09-26 DIAGNOSIS — Z791 Long term (current) use of non-steroidal anti-inflammatories (NSAID): Secondary | ICD-10-CM | POA: Insufficient documentation

## 2015-09-26 DIAGNOSIS — Z880 Allergy status to penicillin: Secondary | ICD-10-CM | POA: Insufficient documentation

## 2015-09-26 DIAGNOSIS — S8991XA Unspecified injury of right lower leg, initial encounter: Secondary | ICD-10-CM | POA: Diagnosis not present

## 2015-09-26 DIAGNOSIS — Y9289 Other specified places as the place of occurrence of the external cause: Secondary | ICD-10-CM | POA: Diagnosis not present

## 2015-09-26 DIAGNOSIS — W228XXA Striking against or struck by other objects, initial encounter: Secondary | ICD-10-CM | POA: Diagnosis not present

## 2015-09-26 DIAGNOSIS — M25561 Pain in right knee: Secondary | ICD-10-CM

## 2015-09-26 NOTE — ED Provider Notes (Signed)
CSN: [id number]     Arrival date & time 09/26/15  2134 History  By signing my name below, I, Carlos Matthews, attest that this documentation has been prepared under the direction and in the presence of Gaylyn Rong, PA-C Electronically Signed: Soijett Matthews, ED Scribe. 09/26/2015. 11:08 PM.   Chief Complaint  Patient presents with  . Leg Pain      The history is provided by the patient. No language interpreter was used.    ALCIDE Matthews is a 55 y.o. male with a medical hx of DM, hyperlipidemia, who presents to the Emergency Department complaining of right leg pain onset 3 weeks. He notes that he slipped and a trailer fell onto his right knee. Pt denies being seen for his symptoms. Pt is having associated symptoms of right knee swelling. He notes that he has tried knee brace with no relief of his symptoms. He denies fever, chills, CP, SOB, numbness, color change, wound, rash, and any other symptoms.    Past Medical History  Diagnosis Date  . Diabetes mellitus without complication (HCC)   . Hyperlipidemia   . GERD (gastroesophageal reflux disease)    Past Surgical History  Procedure Laterality Date  . Tonsillectomy    . Neck surgery    . Rotator cuff repair    . Arm fracture     . Vasectomy    . Fracture surgery     Family History  Problem Relation Age of Onset  . Diabetes Mother   . Kidney disease Mother   . Diabetes Father   . Hypertension Father   . Mental illness Father 27    Alzheimer's dementia  . Diabetes Brother   . Stroke Brother   . Early death Brother   . Diabetes Maternal Grandfather   . Diabetes Paternal Grandmother   . Diabetes Sister   . Lupus Sister    Social History  Substance Use Topics  . Smoking status: Never Smoker   . Smokeless tobacco: Never Used  . Alcohol Use: No    Review of Systems  Constitutional: Negative for fever and chills.  Respiratory: Negative for shortness of breath.   Cardiovascular: Negative for chest pain.   Musculoskeletal: Positive for joint swelling and arthralgias. Negative for gait problem.  Skin: Negative for color change and rash.  Neurological: Negative for numbness.  All other systems reviewed and are negative.     Allergies  Penicillins  Home Medications   Prior to Admission medications   Medication Sig Start Date End Date Taking? Authorizing Provider  ferrous sulfate 325 (65 FE) MG tablet Take 1 tablet (325 mg total) by mouth 2 (two) times daily with a meal. 08/26/14   Emi Belfast, FNP  glipiZIDE (GLUCOTROL XL) 2.5 MG 24 hr tablet TAKE TWO TABLETS BY MOUTH ONCE DAILY WITH BREAKFAST 09/20/15   Thao P Le, DO  glucose blood test strip Use as instructed 07/14/13   Maurice March, MD  JANUVIA 100 MG tablet TAKE ONE TABLET BY MOUTH DAILY 08/05/15   Ethelda Chick, MD  lisinopril (PRINIVIL,ZESTRIL) 5 MG tablet TAKE ONE TABLET BY MOUTH  DAILY 08/31/15   Ethelda Chick, MD  meloxicam (MOBIC) 15 MG tablet Take 1 tablet (15 mg total) by mouth daily. 02/21/15   Ethelda Chick, MD  metFORMIN (GLUCOPHAGE) 1000 MG tablet TAKE ONE TABLET BY MOUTH TWICE DAILY WITH A MEAL 09/11/15   Ethelda Chick, MD  methocarbamol (ROBAXIN) 500 MG tablet Take 1-2 tablets (500-1,000 mg  total) by mouth at bedtime as needed for muscle spasms. 02/21/15   Ethelda Chick, MD  ranitidine (ZANTAC) 150 MG/10ML syrup Take by mouth 2 (two) times daily. 09/17/14   Meredith Pel, NP  simvastatin (ZOCOR) 40 MG tablet Take 1 tablet (40 mg total) by mouth daily. PATIENT NEEDS OFFICE VISIT/FASTING LABS FOR ADDITIONAL REFILLS 02/21/15   Ethelda Chick, MD  zolpidem (AMBIEN) 10 MG tablet Take 1/2 - 1 tablet at bedtime as needed for sleep. 02/21/15   Ethelda Chick, MD   BP 130/80 mmHg  Pulse 77  Temp(Src) 98 F (36.7 C) (Oral)  Resp 16  Ht 5\' 6"  (1.676 m)  Wt 198 lb (89.812 kg)  BMI 31.97 kg/m2  SpO2 98% Physical Exam  Constitutional: He is oriented to person, place, and time. He appears well-developed and  well-nourished. No distress.  HENT:  Head: Normocephalic and atraumatic.  Eyes: Conjunctivae are normal. Right eye exhibits no discharge. Left eye exhibits no discharge. No scleral icterus.  Cardiovascular: Normal rate.   Pulmonary/Chest: Effort normal.  Musculoskeletal:  Negative anterior/poster drawer bilaterally. Negative ballottement test. No varus or valgus laxity. No crepitus. No pain with flexion or extension.Mild swelling to medial aspect of left knee, NTTP. No fluctuance. No erythema or warmth. No streaking.   Neurological: He is alert and oriented to person, place, and time. Coordination normal.  Skin: Skin is warm and dry. No rash noted. He is not diaphoretic. No erythema. No pallor.  Psychiatric: He has a normal mood and affect. His behavior is normal.  Nursing note and vitals reviewed.   ED Course  Procedures (including critical care time) DIAGNOSTIC STUDIES: Oxygen Saturation is 98% on RA, nl by my interpretation.    COORDINATION OF CARE: 11:08 PM Discussed treatment plan with pt at bedside which includes right knee xray and pt agreed to plan.    Labs Review Labs Reviewed - No data to display  Imaging Review Dg Knee Complete 4 Views Right  09/26/2015  CLINICAL DATA:  Trailer fell on right knee 3 weeks ago, with medial right knee knot. Initial encounter. EXAM: RIGHT KNEE - COMPLETE 4+ VIEW COMPARISON:  None. FINDINGS: There is no evidence of fracture or dislocation. The joint spaces are preserved. No significant degenerative change is seen; the patellofemoral joint is grossly unremarkable in appearance. The sunrise view is suboptimal due to limitations in positioning. No significant joint effusion is seen. The patellar tendon is not well characterized. IMPRESSION: No evidence of fracture or dislocation. Electronically Signed   By: Roanna Raider M.D.   On: 09/26/2015 23:51   I have personally reviewed and evaluated these images as part of my medical decision-making.    EKG Interpretation None      MDM   Final diagnoses:  Right knee pain    Pt presents with left medial knee swelling and pain onset 2 weeks ago after dropping a trailor on his. Knee is NTTP. No redness, warmth. No pain with flexion/extension. Pt afebrile.  Doubt septic joint. Patient X-Ray negative for obvious fracture or dislocation or effusion. Intact distal pulses. No calf pain. No CP or SOB. Low suspicion DVT. Medial knee swelling likely due to hematoma or ruptured baker's cyst. Recommend f/u US to rule out. Unfortunately, [place] is not available at this time in the ED. Pt will need to come back tomorrow in order for it to be performed. Pt advised to follow up with orthopedics as well. Patient given brace while in ED, conservative therapy  recommended and discussed. Patient will be discharged home & is agreeable with above plan. Returns precautions discussed. Pt appears safe for discharge.  Case discussed with Dr. Rubin Payor who agrees with above plan.  I personally performed the services described in this documentation, which was scribed in my presence. The recorded information has been reviewed and is accurate.     Lester Kinsman [facility], PA-C 09/27/15 1551  Benjiman Core, MD 09/29/15 [phone]

## 2015-09-26 NOTE — ED Notes (Signed)
Pt from home for eval of leg pain, pt states was lifitng a trailer when he lost his grip and the bell of the trailer landed on pt leg, swelling noted to medial aspect of knee and tenderness noted to touch. Wife states this happened a few weeks ago and made him come get checked out, pt denies any complaints.

## 2015-09-27 ENCOUNTER — Encounter (HOSPITAL_COMMUNITY): Payer: BLUE CROSS/BLUE SHIELD

## 2015-09-27 MED ORDER — NAPROXEN 500 MG PO TABS: 500.0000 mg | ORAL_TABLET | Freq: Two times a day (BID) | ORAL | Status: DC

## 2015-09-27 NOTE — Discharge Instructions (Signed)
Knee Pain Knee pain is a very common symptom and can have many causes. Knee pain often goes away when you follow your health care provider's instructions for relieving pain and discomfort at home. However, knee pain can develop into a condition that needs treatment. Some conditions may include:  Arthritis caused by wear and tear (osteoarthritis).  Arthritis caused by swelling and irritation (rheumatoid arthritis or gout).  A cyst or growth in your knee.  An infection in your knee joint.  An injury that will not heal.  Damage, swelling, or irritation of the tissues that support your knee (torn ligaments or tendinitis). If your knee pain continues, additional tests may be ordered to diagnose your condition. Tests may include X-rays or other imaging studies of your knee. You may also need to have fluid removed from your knee. Treatment for ongoing knee pain depends on the cause, but treatment may include:  Medicines to relieve pain or swelling.  Steroid injections in your knee.  Physical therapy.  Surgery. HOME CARE INSTRUCTIONS  Take medicines only as directed by your health care provider.  Rest your knee and keep it raised (elevated) while you are resting.  Do not do things that cause or worsen pain.  Avoid high-impact activities or exercises, such as running, jumping rope, or doing jumping jacks.  Apply ice to the knee area:  Put ice in a plastic bag.  Place a towel between your skin and the bag.  Leave the ice on for 20 minutes, 2-3 times a day.  Ask your health care provider if you should wear an elastic knee support.  Keep a pillow under your knee when you sleep.  Lose weight if you are overweight. Extra weight can put pressure on your knee.  Do not use any tobacco products, including cigarettes, chewing tobacco, or electronic cigarettes. If you need help quitting, ask your health care provider. Smoking may slow the healing of any bone and joint problems that you may  have. SEEK MEDICAL CARE IF:  Your knee pain continues, changes, or gets worse.  You have a fever along with knee pain.  Your knee buckles or locks up.  Your knee becomes more swollen. SEEK IMMEDIATE MEDICAL CARE IF:   Your knee joint feels hot to the touch.  You have chest pain or trouble breathing.   This information is not intended to replace advice given to you by your health care provider. Make sure you discuss any questions you have with your health care provider.  Return to [facility] ED tomorrow for ultrasound. Apply ice to affected area. Keep knee sleeve on. Follow up with orthopedic provider for re-evaluation. Take anti-inflammatories as prescribed. Return to the ED if you experience severe worsening of your symptoms, chest pain, shortness of breath, fever, redness/warmth of joint.

## 2015-09-27 NOTE — Progress Notes (Signed)
Orthopedic Tech Progress Note Patient Details:  Carlos Matthews 02/28/1961 [id number]  Ortho Devices Type of Ortho Device: Knee Sleeve Ortho Device/Splint Location: rle Ortho Device/Splint Interventions: Ordered, Application   Trinna Post 09/27/2015, 12:09 AM

## 2015-10-25 ENCOUNTER — Other Ambulatory Visit: Payer: Self-pay | Admitting: Family Medicine

## 2015-11-08 ENCOUNTER — Ambulatory Visit: Payer: BLUE CROSS/BLUE SHIELD | Admitting: Family Medicine

## 2015-11-10 ENCOUNTER — Encounter: Payer: Self-pay | Admitting: Podiatry

## 2015-11-10 ENCOUNTER — Ambulatory Visit (INDEPENDENT_AMBULATORY_CARE_PROVIDER_SITE_OTHER): Payer: BLUE CROSS/BLUE SHIELD | Admitting: Podiatry

## 2015-11-10 VITALS — BP 114/76 | HR 79 | Resp 16

## 2015-11-10 DIAGNOSIS — M2040 Other hammer toe(s) (acquired), unspecified foot: Secondary | ICD-10-CM

## 2015-11-10 DIAGNOSIS — E1140 Type 2 diabetes mellitus with diabetic neuropathy, unspecified: Secondary | ICD-10-CM | POA: Diagnosis not present

## 2015-11-10 DIAGNOSIS — B351 Tinea unguium: Secondary | ICD-10-CM

## 2015-11-10 NOTE — Progress Notes (Signed)
Subjective:     Patient ID: Carlos Matthews, male   DOB: 04/12/1961, 55 y.o.   MRN: [id number]  HPI patient presents with discoloration at the interphalangeal joints bilateral with some irritation of tissue   Review of Systems     Objective:   Physical Exam Neurovascular status intact no change in health history with mild darkness on the interphalangeal joint distal second and third toes bilateral    Assessment:     Hammertoe deformity with probable friction creating discoloration but no current pain    Plan:     H&P and condition reviewed and since there is no pain I do not recommend treatment and less were to get worse. Patient will be seen back for us to recheck again if pain were started to develop we offered padding to the patient

## 2015-11-24 ENCOUNTER — Ambulatory Visit (INDEPENDENT_AMBULATORY_CARE_PROVIDER_SITE_OTHER): Payer: Self-pay | Admitting: Family Medicine

## 2015-11-24 ENCOUNTER — Ambulatory Visit (INDEPENDENT_AMBULATORY_CARE_PROVIDER_SITE_OTHER): Payer: BLUE CROSS/BLUE SHIELD | Admitting: Family Medicine

## 2015-11-24 ENCOUNTER — Encounter: Payer: Self-pay | Admitting: Family Medicine

## 2015-11-24 VITALS — BP 114/70 | HR 86 | Temp 98.0°F | Resp 18 | Ht 66.0 in | Wt 190.0 lb

## 2015-11-24 VITALS — BP 114/70 | HR 86 | Temp 98.0°F | Resp 18 | Ht 66.0 in | Wt 190.6 lb

## 2015-11-24 DIAGNOSIS — E1165 Type 2 diabetes mellitus with hyperglycemia: Secondary | ICD-10-CM

## 2015-11-24 DIAGNOSIS — Z13220 Encounter for screening for lipoid disorders: Secondary | ICD-10-CM

## 2015-11-24 DIAGNOSIS — IMO0001 Reserved for inherently not codable concepts without codable children: Secondary | ICD-10-CM

## 2015-11-24 DIAGNOSIS — I10 Essential (primary) hypertension: Secondary | ICD-10-CM | POA: Diagnosis not present

## 2015-11-24 DIAGNOSIS — Z024 Encounter for examination for driving license: Secondary | ICD-10-CM

## 2015-11-24 DIAGNOSIS — Z021 Encounter for pre-employment examination: Secondary | ICD-10-CM

## 2015-11-24 LAB — COMPLETE METABOLIC PANEL WITH GFR
ALT: 40 U/L (ref 9–46)
AST: 31 U/L (ref 10–35)
Albumin: 4.2 g/dL (ref 3.6–5.1)
Alkaline Phosphatase: 52 U/L (ref 40–115)
BUN: 14 mg/dL (ref 7–25)
CHLORIDE: 101 mmol/L (ref 98–110)
CO2: 25 mmol/L (ref 20–31)
Calcium: 9.5 mg/dL (ref 8.6–10.3)
Creat: 1.05 mg/dL (ref 0.70–1.33)
GFR, Est African American: >89 mL/min (ref >=60)
GFR, Est Non African American: 78 mL/min (ref >=60)
GLUCOSE: 142 mg/dL — AB (ref 65–99)
POTASSIUM: 4.5 mmol/L (ref 3.5–5.3)
SODIUM: 137 mmol/L (ref 135–146)
Total Bilirubin: 0.4 mg/dL (ref 0.2–1.2)
Total Protein: 6.9 g/dL (ref 6.1–8.1)

## 2015-11-24 LAB — POCT GLYCOSYLATED HEMOGLOBIN (HGB A1C): Hemoglobin A1C: 9.3

## 2015-11-24 LAB — LIPID PANEL
CHOL/HDL RATIO: 3 ratio (ref <=5.0)
Cholesterol: 129 mg/dL (ref 125–200)
HDL: 43 mg/dL (ref >=40)
LDL CALC: 74 mg/dL (ref <130)
Triglycerides: 60 mg/dL (ref <150)
VLDL: 12 mg/dL (ref <30)

## 2015-11-24 LAB — GLUCOSE, POCT (MANUAL RESULT ENTRY): POC GLUCOSE: 160 mg/dL — AB (ref 70–99)

## 2015-11-24 MED ORDER — LISINOPRIL 5 MG PO TABS: 5.0000 mg | ORAL_TABLET | Freq: Every day | ORAL | Status: DC

## 2015-11-24 MED ORDER — METFORMIN HCL 1000 MG PO TABS: 1000.0000 mg | ORAL_TABLET | Freq: Two times a day (BID) | ORAL | Status: DC

## 2015-11-24 MED ORDER — GLIPIZIDE 5 MG PO TABS: 5.0000 mg | ORAL_TABLET | Freq: Two times a day (BID) | ORAL | Status: DC

## 2015-11-24 MED ORDER — SIMVASTATIN 20 MG PO TABS: 10.0000 mg | ORAL_TABLET | Freq: Every day | ORAL | Status: DC

## 2015-11-24 NOTE — Progress Notes (Addendum)
Subjective:  By signing my name below, I, Carlos Matthews, attest that this documentation has been prepared under the direction and in the presence of [name], MD. Electronically Signed: Stann Matthews, Scribe. 11/24/2015 , 4:30 PM .  Patient was seen in Room 4 .   Patient ID: Carlos Matthews, male    DOB: November 17, 1960, 55 y.o.   MRN: [id number] Chief Complaint  Patient presents with  . Diabetes   HPI Carlos Matthews is a 55 y.o. male  Type 2 DM No neuropathy known.   He's on metformin  bid and glipizide  qd.   Question regarding januvia having side effects of muscle aches, joint aches and abdominal pain.   Lab Results  Component Value Date   HGBA1C 8.9 12/06/2014   No results found for: Carlos Matthews  Vision He sees eye doctor within the year.   Dentist He last saw dentist in the past year.   HLD Lab Results  Component Value Date   CHOL 151 02/21/2015   HDL 50 02/21/2015   LDLCALC 85 02/21/2015   TRIG 79 02/21/2015   CHOLHDL 3.0 02/21/2015   Lab Results  Component Value Date   ALT 18 02/21/2015   AST 17 02/21/2015   ALKPHOS 41 02/21/2015   BILITOT 0.4 02/21/2015   He was prescribed simvastatin  qd, but states Dr. Perrin Maltese instructed him to cut them into quarters.  So, he takes simvastatin , 1/4 tablet a day.   Patient Active Problem List   Diagnosis Date Noted  . Microcytic anemia 09/21/2014  . Sleep disorder 09/17/2013  . Health care maintenance 11/16/2011  . Hyperlipidemia LDL goal < 100 11/16/2011  . Type 2 diabetes mellitus not at goal [facility]) 11/16/2011  . Obesity (BMI 30.0-34.9) 11/16/2011   Past Medical History  Diagnosis Date  . Diabetes mellitus without complication (HCC)   . Hyperlipidemia   . GERD (gastroesophageal reflux disease)    Past Surgical History  Procedure Laterality Date  . Tonsillectomy    . Neck surgery    . Rotator cuff repair    . Arm fracture     . Vasectomy    . Fracture surgery      Allergies  Allergen Reactions  . Penicillins Rash    Swell of eyes   Prior to Admission medications   Medication Sig Start Date End Date Taking? Authorizing Provider  glipiZIDE (GLUCOTROL XL) 2.5 MG 24 hr tablet TAKE TWO TABLETS BY MOUTH ONCE DAILY WITH  BREAKFAST 10/26/15  Yes Thao P Le, DO  glucose blood test strip Use as instructed 07/14/13  Yes Maurice March, MD  lisinopril (PRINIVIL,ZESTRIL) 5 MG tablet TAKE ONE TABLET BY MOUTH  DAILY 08/31/15  Yes Ethelda Chick, MD  metFORMIN (GLUCOPHAGE) 1000 MG tablet TAKE ONE TABLET BY MOUTH TWICE DAILY WITH A MEAL 09/11/15  Yes Ethelda Chick, MD  naproxen (NAPROSYN) 500 MG tablet Take 1 tablet (500 mg total) by mouth 2 (two) times daily. 09/27/15  Yes Samantha Tripp Dowless, PA-C  ranitidine (ZANTAC) 150 MG/10ML syrup Take by mouth 2 (two) times daily. 09/17/14  Yes Meredith Pel, NP  simvastatin (ZOCOR) 40 MG tablet Take 1 tablet (40 mg total) by mouth daily. PATIENT NEEDS OFFICE VISIT/FASTING LABS FOR ADDITIONAL REFILLS 02/21/15  Yes Ethelda Chick, MD  zolpidem (AMBIEN) 10 MG tablet Take 1/2 - 1 tablet at bedtime as needed for sleep. 02/21/15  Yes Ethelda Chick, MD   Social History   Social  History  . Marital Status: Single    Spouse Name: N/A  . Number of Children: N/A  . Years of Education: N/A   Occupational History  . Not on file.   Social History Main Topics  . Smoking status: Never Smoker   . Smokeless tobacco: Never Used  . Alcohol Use: No  . Drug Use: No  . Sexual Activity: Not on file   Other Topics Concern  . Not on file   Social History Narrative   Marital status:  Married x 27 years      Children: 4 children; 3 grandchildren      Employment:  Conservation officer, historic buildings x 5 years; truck driver x 36 years      Tobacco: none      Alcohol: none      Exercise: 4 days per week         Review of Systems  Constitutional: Negative for fatigue and unexpected weight change.  Eyes: Negative for visual disturbance.   Respiratory: Negative for cough, chest tightness and shortness of breath.   Cardiovascular: Negative for chest pain, palpitations and leg swelling.  Gastrointestinal: Negative for abdominal pain and blood in stool.  Neurological: Negative for dizziness, light-headedness and headaches.       Objective:   Physical Exam  Constitutional: He is oriented to person, place, and time. He appears well-developed and well-nourished.  HENT:  Head: Normocephalic and atraumatic.  Eyes: EOM are normal. Pupils are equal, round, and reactive to light.  Neck: No JVD present. Carotid bruit is not present.  Cardiovascular: Normal rate, regular rhythm and normal heart sounds.   No murmur heard. Pulmonary/Chest: Effort normal and breath sounds normal. He has no rales.  Musculoskeletal: He exhibits no edema.  Neurological: He is alert and oriented to person, place, and time.  Skin: Skin is warm and dry.  Psychiatric: He has a normal mood and affect.  Vitals reviewed.   Filed Vitals:   11/24/15 1559  BP: 114/70  Pulse: 86  Temp: 98 F (36.7 C)  Resp: 18  Height:  (1.676 m)  Weight: 190 lb (86.183 kg)   Results for orders placed or performed in visit on 11/24/15  POCT glucose (manual entry)  Result Value Ref Range   POC Glucose 160 (A) 70 - 99 mg/dl  POCT glycosylated hemoglobin (Hb A1C)  Result Value Ref Range   Hemoglobin A1C 9.3        Assessment & Plan:   Carlos Matthews is a 55 y.o. male Uncontrolled type 2 diabetes mellitus without complication, without long-term current use of insulin (HCC) - Plan: COMPLETE METABOLIC PANEL WITH GFR, Microalbumin, urine, POCT glucose (manual entry), POCT glycosylated hemoglobin (Hb A1C), simvastatin (ZOCOR) 20 MG tablet, metFORMIN (GLUCOPHAGE) 1000 MG tablet, glipiZIDE (GLUCOTROL) 5 MG tablet  -Still uncontrolled. Some confusion regarding Januvia versus glipizide use, was possible side effects with Januvia. He has been taking 5 mg of glipizide QD  as of late.  -Increase to 5 mg twice a day glipizide, continue metformin 1000 mg twice a day. Recheck with A1c in 3 months.  -Check urine microalbumin, up-to-date with dentist and optho.   Screening for hyperlipidemia - Plan: Lipid panel, simvastatin (ZOCOR) 20 MG tablet  -Previously split 40 mg pill into quarters. Decreased dose of Zocor 20 mg - half tablet daily.  Essential hypertension - Plan: COMPLETE METABOLIC PANEL WITH GFR, lisinopril (PRINIVIL,ZESTRIL) 5 MG tablet  -Stable. Continue same dose of lisinopril. CMP and lipid panel pending.  Meds ordered this encounter  Medications  . simvastatin (ZOCOR) 20 MG tablet    Sig: Take 0.5 tablets (10 mg total) by mouth daily at 6 PM.    Dispense:  90 tablet    Refill:  1  . metFORMIN (GLUCOPHAGE) 1000 MG tablet    Sig: Take 1 tablet (1,000 mg total) by mouth 2 (two) times daily with a meal.    Dispense:  180 tablet    Refill:  1  . glipiZIDE (GLUCOTROL) 5 MG tablet    Sig: Take 1 tablet (5 mg total) by mouth 2 (two) times daily before a meal.    Dispense:  60 tablet    Refill:  3  . lisinopril (PRINIVIL,ZESTRIL) 5 MG tablet    Sig: Take 1 tablet (5 mg total) by mouth daily.    Dispense:  90 tablet    Refill:  1   Patient Instructions       IF you received an x-ray today, you will receive an invoice from [facility]. Please contact [facility] at [phone] with questions or concerns regarding your invoice.   IF you received labwork today, you will receive an invoice from [facility]. Please contact [facility] at [phone] with questions or concerns regarding your invoice.   Our billing staff will not be able to assist you with questions regarding bills from these companies.  You will be contacted with the lab results as soon as they are available. The fastest way to get your results is to activate your My Chart account. Instructions are located on the last page of this paperwork.  If you have not heard from us regarding the results in 2 weeks, please contact this office.     Diabetes is not controlled. Hemoglobin A1c is in the nines. This needs to be under 7. Continue metformin twice per day for now, continue glipizide, but can increase that to twice per day with meals. Watch for low blood sugar symptoms, and if you experience these, return right away. Do not drive if you are having low blood sugar symptoms. Return in 3 months for diabetes check.     I personally performed the services described in this documentation, which was scribed in my presence. The recorded information has been reviewed and considered, and addended by me as needed.

## 2015-11-24 NOTE — Progress Notes (Addendum)
Subjective:  By signing my name below, I, Stann Oresung-Kai Tsai, attest that this documentation has been prepared under the direction and in the presence of Meredith StaggersJeffrey Masaye Gatchalian, MD. Electronically Signed: Stann Oresung-Kai Tsai, Scribe. 11/24/2015 , 3:37 PM .  Patient was seen in Room 4 .   Patient ID: Carlos Matthews, male    DOB: January 11, 1961, 55 y.o.   MRN: [id number] Chief Complaint  Patient presents with  . Employment Physical    DOT PE   HPI Carlos Augustercy R Scrima is a 55 y.o. male Here for DOT physical. 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 His last meal was 5:00AM today.   DM Last visit with Dr. Katrinka BlazingSmith was July 2016. Home blood sugars controlled at that time but was uncontrolled 2 months prior. Last A1c was 8.9 in May 2016. Prior to that his A1c was 9.9 in January 2016. Plan at last visit was to recheck in 3 months. He was seen by foot doctor 2 weeks ago.   He states that he called the office and was instructed to stop taking [place] because he was having a rash, joint pain and abdominal pain. He's taking metformin 1000mg  bid, and glipizide 5mg  qd. He denies missing any doses. His home blood sugars range 198 up to 204, highest at 220.   Patient states taking glipizide 5mg  qd, but based on CHL, he had #30 glipizide 2.5mg  sent in.  Based on [facility]CHL, he had 3 months of glipizide and #60 glipizide on 2/14, and 3/22 another prescription for 30 glipizide.   HTN He takes lisinopril 5mg  qd. He denies snoring, sleep apnea, or sleepiness after lunch.   He's taking 1/4 simvastatin 40mg  qd. He's written 1 pill qd, but states Dr. Perrin MalteseGuest instructed him to take quarter pills. He denies alcohol use.   Vision No vision disturbances or glares at night  Anemia H/o iron deficiency anemia. Up to date on colonoscopy. Hemoglobin normal in the last 2 visits. He denies dizziness, lightheadedness  or blood in stool. He denies taking an iron supplement.   Sciatic Pain Referred to ortho for neck pain last year. He denies any weakness or numbness in his arms and legs.   Work He works for [employer] ground, since Nov 2011.   Patient Active Problem List   Diagnosis Date Noted  . Microcytic anemia 09/21/2014  . Sleep disorder 09/17/2013  . Health care maintenance 11/16/2011  . Hyperlipidemia LDL goal < 100 11/16/2011  . Type 2 diabetes mellitus not at goal [facility](HCC) 11/16/2011  . Obesity (BMI 30.0-34.9) 11/16/2011   Past Medical History  Diagnosis Date  . Diabetes mellitus without complication (HCC)   . Hyperlipidemia   . GERD (gastroesophageal reflux disease)    Past Surgical History  Procedure Laterality Date  . Tonsillectomy    . Neck surgery    . Rotator cuff repair    . Arm fracture     . Vasectomy    . Fracture surgery     Allergies  Allergen Reactions  . Penicillins Rash    Swell of eyes   Prior to Admission medications   Medication Sig Start Date End Date Taking? Authorizing Provider  ferrous sulfate 325 (65 FE) MG tablet Take 1 tablet (325 mg total) by mouth 2 (two) times daily with a meal. 08/26/14  Yes Emi Belfasteborah B Gessner, FNP  glipiZIDE (GLUCOTROL XL) 2.5 MG  24 hr tablet TAKE TWO TABLETS BY MOUTH ONCE DAILY WITH  BREAKFAST 10/26/15  Yes Thao P Le, DO  glucose blood test strip Use as instructed 07/14/13  Yes Maurice March, MD  lisinopril (PRINIVIL,ZESTRIL) 5 MG tablet TAKE ONE TABLET BY MOUTH  DAILY 08/31/15  Yes Ethelda Chick, MD  metFORMIN (GLUCOPHAGE) 1000 MG tablet TAKE ONE TABLET BY MOUTH TWICE DAILY WITH A MEAL 09/11/15  Yes Ethelda Chick, MD  naproxen (NAPROSYN) 500 MG tablet Take 1 tablet (500 mg total) by mouth 2 (two) times daily. 09/27/15  Yes Samantha Tripp Dowless, PA-C  ranitidine (ZANTAC) 150 MG/10ML syrup Take by mouth 2 (two) times daily. 09/17/14  Yes Meredith Pel, NP  simvastatin (ZOCOR) 40 MG tablet Take 1 tablet (40 mg total) by mouth daily.  PATIENT NEEDS OFFICE VISIT/FASTING LABS FOR ADDITIONAL REFILLS 02/21/15  Yes Ethelda Chick, MD  zolpidem (AMBIEN) 10 MG tablet Take 1/2 - 1 tablet at bedtime as needed for sleep. 02/21/15  Yes Ethelda Chick, MD  JANUVIA 100 MG tablet TAKE ONE TABLET BY MOUTH DAILY Patient not taking: Reported on 11/24/2015 08/05/15   Ethelda Chick, MD  meloxicam (MOBIC) 15 MG tablet Take 1 tablet (15 mg total) by mouth daily. Patient not taking: Reported on 11/24/2015 02/21/15   Ethelda Chick, MD  methocarbamol (ROBAXIN) 500 MG tablet Take 1-2 tablets (500-1,000 mg total) by mouth at bedtime as needed for muscle spasms. Patient not taking: Reported on 11/24/2015 02/21/15   Ethelda Chick, MD   Social History   Social History  . Marital Status: Single    Spouse Name: N/A  . Number of Children: N/A  . Years of Education: N/A   Occupational History  . Not on file.   Social History Main Topics  . Smoking status: Never Smoker   . Smokeless tobacco: Never Used  . Alcohol Use: No  . Drug Use: No  . Sexual Activity: Not on file   Other Topics Concern  . Not on file   Social History Narrative   Marital status:  Married x 27 years      Children: 4 children; 3 grandchildren      Employment:  Conservation officer, historic buildings x 5 years; truck driver x 36 years      Tobacco: none      Alcohol: none      Exercise: 4 days per week         Review of Systems  Constitutional: Negative for fatigue and unexpected weight change.  Eyes: Negative for visual disturbance.  Respiratory: Negative for cough, chest tightness and shortness of breath.   Cardiovascular: Negative for chest pain, palpitations and leg swelling.  Gastrointestinal: Negative for abdominal pain and blood in stool.  Neurological: Negative for dizziness, light-headedness and headaches.   13 point ROS - per dot paperwork reviewed.      Objective:   Physical Exam  Constitutional: He is oriented to person, place, and time. He appears well-developed and  well-nourished.  HENT:  Head: Normocephalic and atraumatic.  Right Ear: External ear normal.  Left Ear: External ear normal.  Mouth/Throat: Oropharynx is clear and moist.  Eyes: Conjunctivae and EOM are normal. Pupils are equal, round, and reactive to light.  Neck: Normal range of motion. Neck supple. No thyromegaly present.  Cardiovascular: Normal rate, regular rhythm, normal heart sounds and intact distal pulses.   Pulmonary/Chest: Effort normal and breath sounds normal. No respiratory distress. He has no wheezes.  Abdominal: Soft. He exhibits no distension. There is no tenderness. Hernia confirmed negative in the right inguinal area and confirmed negative in the left inguinal area.  Musculoskeletal: Normal range of motion. He exhibits no edema or tenderness.  Lymphadenopathy:    He has no cervical adenopathy.  Neurological: He is alert and oriented to person, place, and time. He has normal reflexes.  Skin: Skin is warm and dry.  Psychiatric: He has a normal mood and affect. His behavior is normal.  Vitals reviewed.   Filed Vitals:   11/24/15 1435  BP: 114/70  Pulse: 86  Temp: 98 F (36.7 C)  TempSrc: Oral  Resp: 18  Height:  (1.676 m)  Weight: 190 lb 9.6 oz (86.456 kg)  SpO2: 98%   Lab Results  Component Value Date   HGBA1C 9.3 11/24/2015       Assessment & Plan:  LORA GLOMSKI is a 55 y.o. male No diagnosis found.   No orders of the defined types were placed in this encounter.   Patient Instructions       IF you received an x-ray today, you will receive an invoice from [facility]. Please contact [facility] at [phone] with questions or concerns regarding your invoice.   IF you received labwork today, you will receive an invoice from [facility]. Please contact [facility] at [phone] with questions or concerns regarding your invoice.   Our billing staff will not be able to assist you with questions  regarding bills from these companies.  You will be contacted with the lab results as soon as they are available. The fastest way to get your results is to activate your My Chart account. Instructions are located on the last page of this paperwork. If you have not heard from [place] regarding the results in 2 weeks, please contact this office.    One year card provided due to history of hypertension and diabetes. 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 Follow-up as discussed for your diabetes in 3 months with primary care provider.     I personally performed the services described in this documentation, which was scribed in my presence. The recorded information has been reviewed and considered, and addended by me as needed.

## 2015-11-24 NOTE — Patient Instructions (Addendum)
     IF you received an x-ray today, you will receive an invoice from [facility]. Please contact [facility] at [phone] with questions or concerns regarding your invoice.   IF you received labwork today, you will receive an invoice from [facility]. Please contact [facility] at [phone] with questions or concerns regarding your invoice.   Our billing staff will not be able to assist you with questions regarding bills from these companies.  You will be contacted with the lab results as soon as they are available. The fastest way to get your results is to activate your My Chart account. Instructions are located on the last page of this paperwork. If you have not heard from us regarding the results in 2 weeks, please contact this office.     Diabetes is not controlled. Hemoglobin A1c is in the nines. This needs to be under 7. Continue metformin twice per day for now, continue glipizide, but can increase that to twice per day with meals. Watch for low blood sugar symptoms, and if you experience these, return right away. Do not drive if you are having low blood sugar symptoms. Return in 3 months for diabetes check.

## 2015-11-24 NOTE — Patient Instructions (Addendum)
     IF you received an x-ray today, you will receive an invoice from [facility]. Please contact [facility] at [phone] with questions or concerns regarding your invoice.   IF you received labwork today, you will receive an invoice from [facility]. Please contact [facility] at [phone] with questions or concerns regarding your invoice.   Our billing staff will not be able to assist you with questions regarding bills from these companies.  You will be contacted with the lab results as soon as they are available. The fastest way to get your results is to activate your My Chart account. Instructions are located on the last page of this paperwork. If you have not heard from us regarding the results in 2 weeks, please contact this office.    One year card provided due to history of hypertension and diabetes. 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 Follow-up as discussed for your diabetes in 3 months with primary care provider.

## 2015-11-25 LAB — MICROALBUMIN, URINE: Microalb, Ur: 0.7 mg/dL

## 2015-11-27 ENCOUNTER — Other Ambulatory Visit: Payer: Self-pay | Admitting: Family Medicine

## 2015-11-29 NOTE — Telephone Encounter (Signed)
1. Refills --- please call in refill of Ambien as approved.  2.  Scheduling --- please schedule OV with me for follow-up DM next available appointment.

## 2015-11-29 NOTE — Telephone Encounter (Signed)
Called in Rx

## 2015-11-30 NOTE — Telephone Encounter (Signed)
Rx faxed to pharmacy  

## 2015-12-12 ENCOUNTER — Encounter: Payer: Self-pay | Admitting: *Deleted

## 2016-03-26 ENCOUNTER — Other Ambulatory Visit: Payer: Self-pay | Admitting: Family Medicine

## 2016-03-26 DIAGNOSIS — E1165 Type 2 diabetes mellitus with hyperglycemia: Principal | ICD-10-CM

## 2016-03-26 DIAGNOSIS — IMO0001 Reserved for inherently not codable concepts without codable children: Secondary | ICD-10-CM

## 2016-04-11 ENCOUNTER — Other Ambulatory Visit: Payer: Self-pay | Admitting: Family Medicine

## 2016-04-13 NOTE — Telephone Encounter (Signed)
Ambien refill approved.

## 2016-04-16 NOTE — Telephone Encounter (Signed)
Called in.

## 2016-04-22 ENCOUNTER — Other Ambulatory Visit: Payer: Self-pay | Admitting: Family Medicine

## 2016-04-22 DIAGNOSIS — E1165 Type 2 diabetes mellitus with hyperglycemia: Principal | ICD-10-CM

## 2016-04-22 DIAGNOSIS — IMO0001 Reserved for inherently not codable concepts without codable children: Secondary | ICD-10-CM

## 2017-02-22 IMAGING — CT CT CERVICAL SPINE W/O CM
4 of 6 series · 13 of 33 positions shown, 15 images · non-contrast
Comparison: None.

CLINICAL DATA: Left shoulder and arm pain. Numbness going down the
left arm to the middle of the ring fingers.

EXAM:
CT CERVICAL SPINE WITHOUT CONTRAST
TECHNIQUE: Multidetector CT imaging of the cervical spine was performed without
intravenous contrast. Multiplanar CT image reconstructions were also
generated.

[Series 6: thin bone · axial · 0.26mm/px · z∈[-168,-144]mm · 2 of 197 slices shown]
[im 40/197  bone]
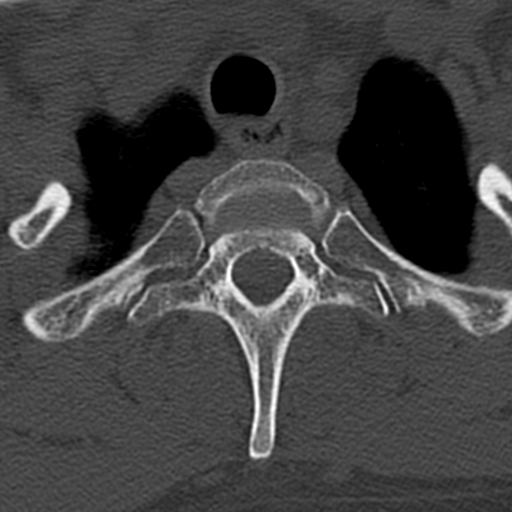
[im 79/197  bone]
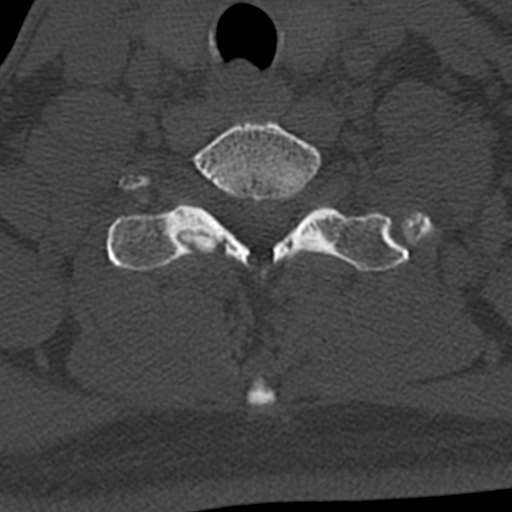

[Series 7: cor · coronal · 0.26mm/px · 3 of 47 slices shown]
[im 10/47  bone]
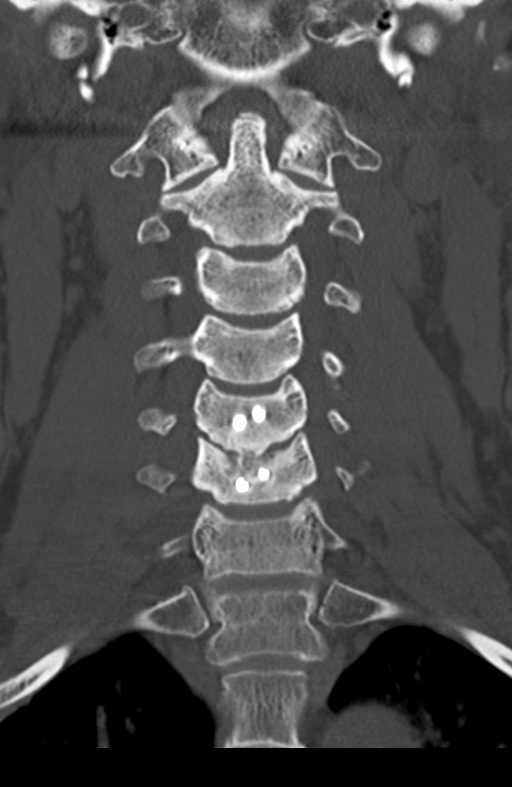
[im 19/47  bone]
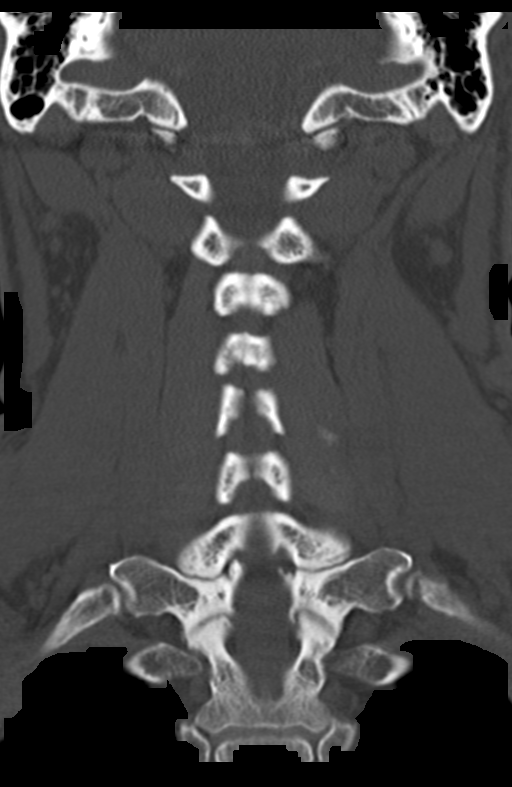
[im 28/47  bone]
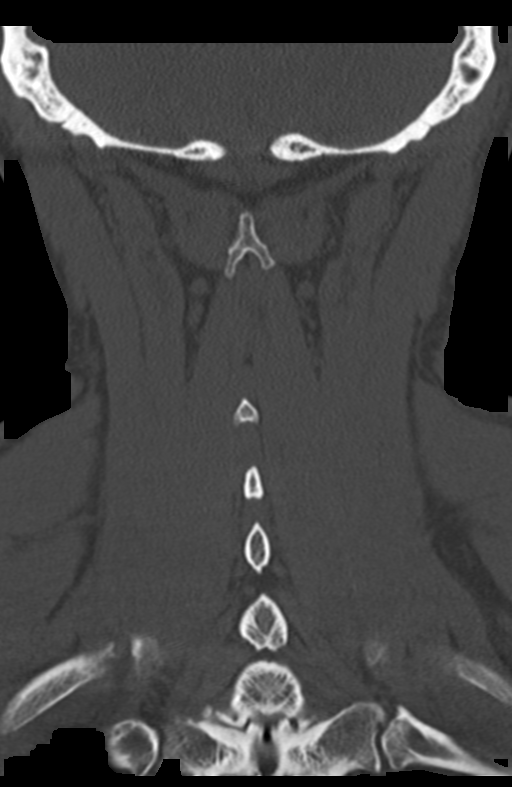

[Series 8: sag · sagittal · 0.26mm/px · 5 of 33 slices shown, 6 images]
[im 11/33  bone]
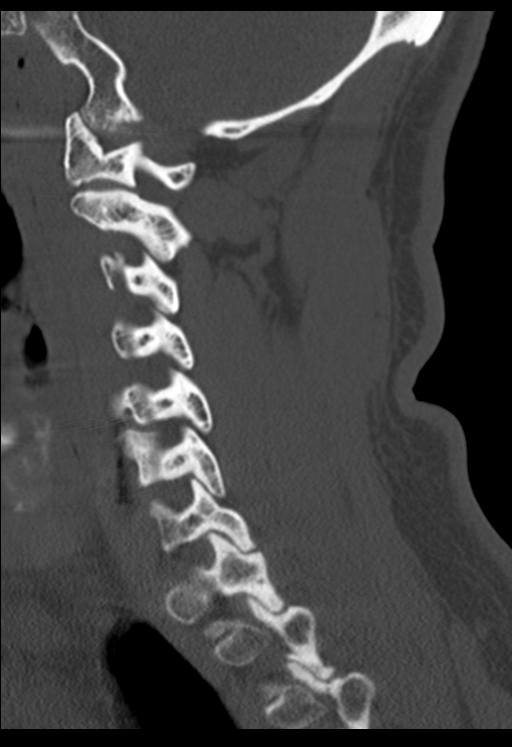
[im 14/33  bone]
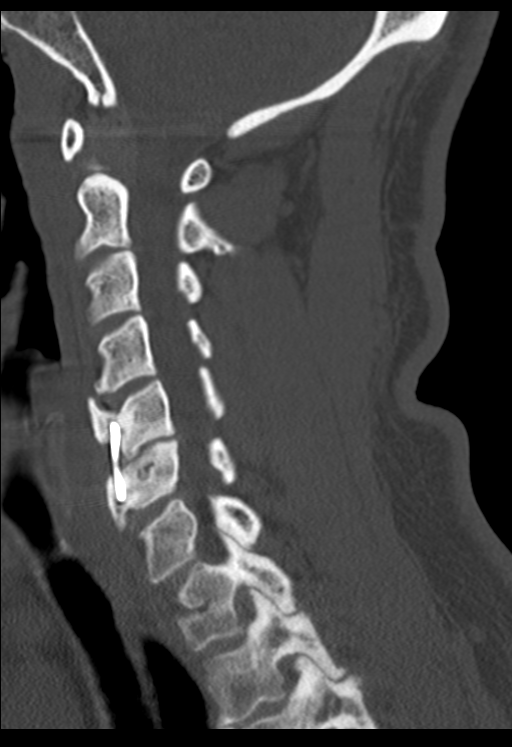
[im 17/33  soft-tissue]
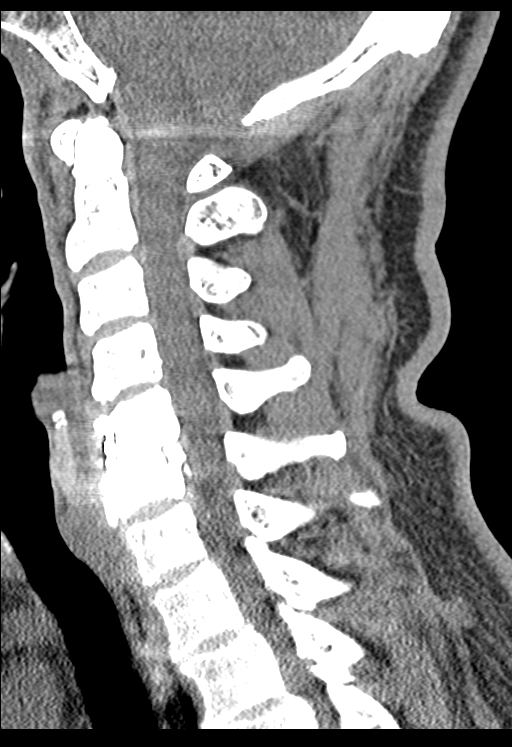
[im 17/33  bone]
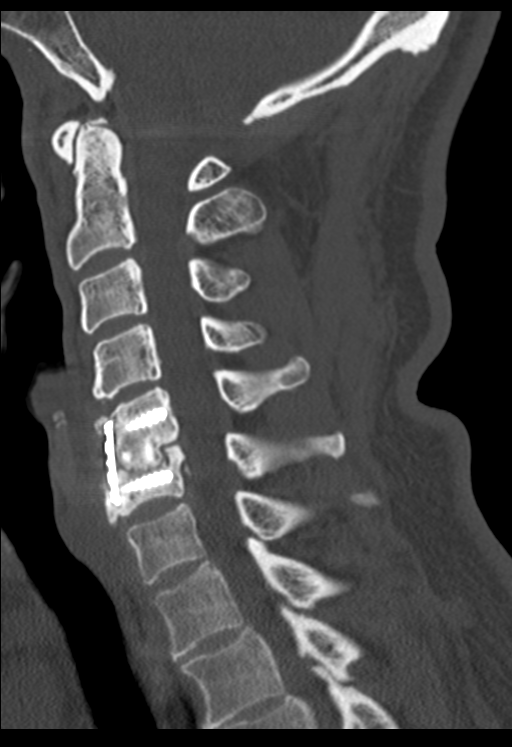
[im 19/33  bone]
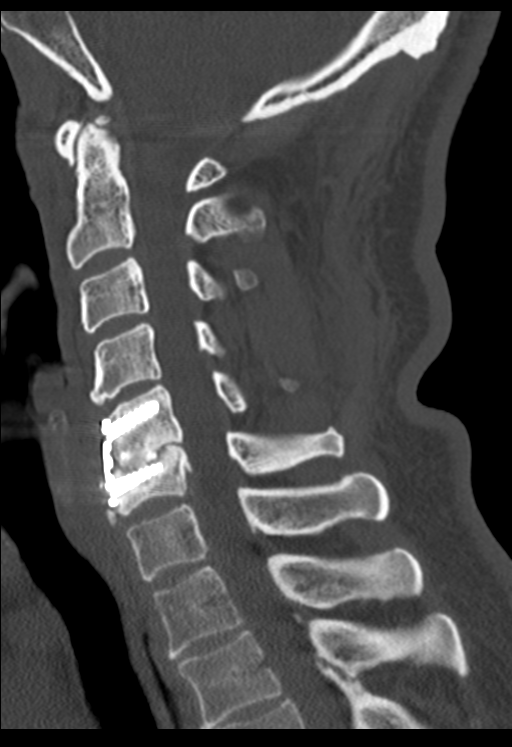
[im 22/33  bone]
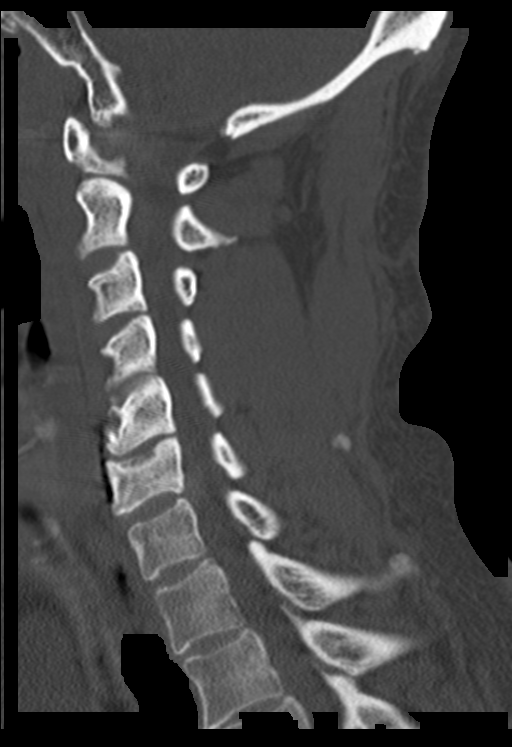

[Series 9: angled axial · axial · 0.23mm/px · z∈[-195,-13]mm · 3 of 95 slices shown, 4 images]
[im 1/95  soft-tissue]
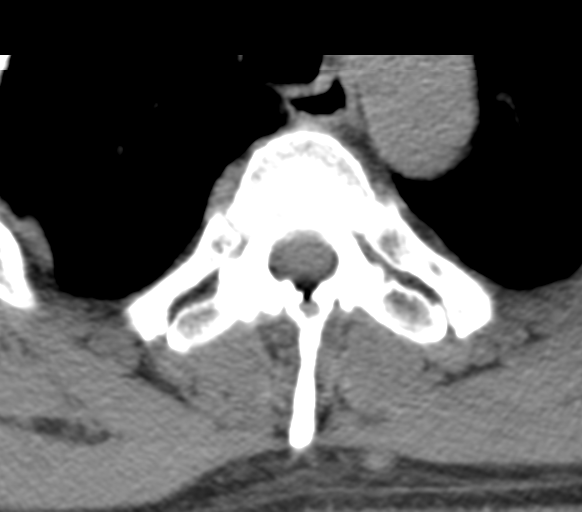
[im 1/95  bone]
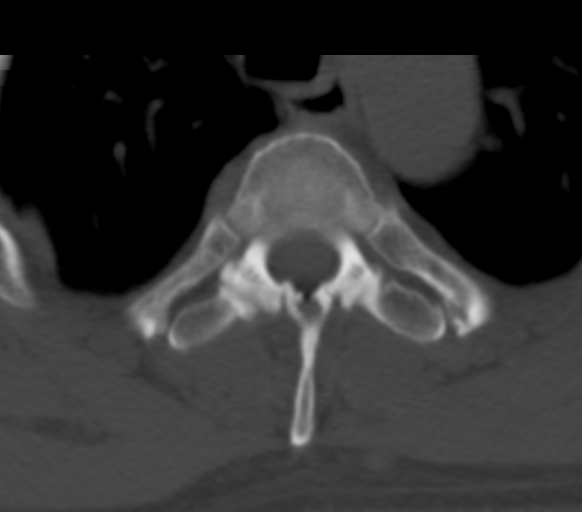
[im 48/95  bone]
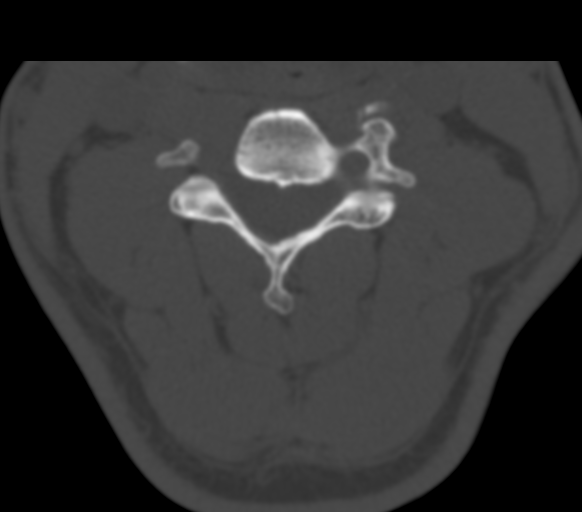
[im 95/95  bone]
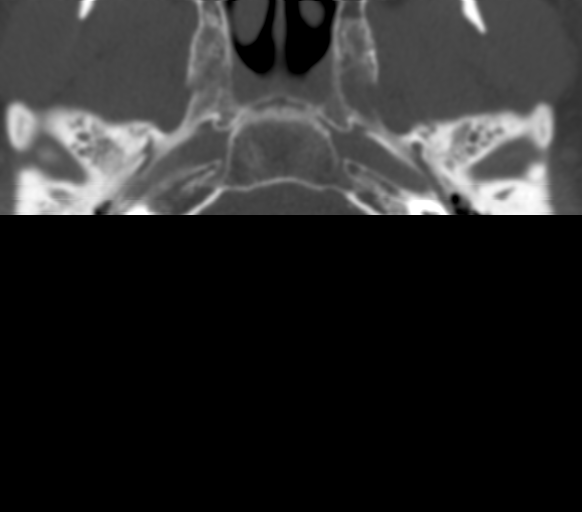

[13 of 33 positions shown; findings below may reference images not displayed]

FINDINGS: The alignment is anatomic. The vertebral body heights are
maintained. There is no acute fracture. There is no static
listhesis. The prevertebral soft tissues are normal. The intraspinal
soft tissues are not fully imaged on this examination due to poor
soft tissue contrast, but there is no gross soft tissue abnormality.

There is anterior cervical fusion at C5-6 without hardware failure
or complication. There is incomplete osseous bridging across the
disc space concerning for pseudoarthrosis.

The remainder the disc spaces are relatively well preserved. There
is no significant cervical foraminal stenosis.

The visualized portions of the lung apices demonstrate no focal
abnormality.
IMPRESSION: 1. Anterior cervical fusion at C5-6 without hardware failure or
complication. There is incomplete osseous bridging across the disc
space concerning for pseudoarthrosis.

## 2017-12-30 ENCOUNTER — Encounter: Payer: Self-pay | Admitting: Family Medicine

## 2018-02-20 ENCOUNTER — Other Ambulatory Visit: Payer: Self-pay | Admitting: Neurosurgery

## 2018-02-20 DIAGNOSIS — M5412 Radiculopathy, cervical region: Secondary | ICD-10-CM

## 2018-02-21 ENCOUNTER — Encounter (HOSPITAL_COMMUNITY): Payer: Self-pay | Admitting: Emergency Medicine

## 2018-02-21 ENCOUNTER — Emergency Department (HOSPITAL_COMMUNITY)
Admission: EM | Admit: 2018-02-21 | Discharge: 2018-02-22 | Disposition: A | Discharge status: Home / Self Care | Payer: BLUE CROSS/BLUE SHIELD | Attending: Emergency Medicine | Admitting: Emergency Medicine

## 2018-02-21 ENCOUNTER — Emergency Department (HOSPITAL_COMMUNITY): Payer: BLUE CROSS/BLUE SHIELD

## 2018-02-21 DIAGNOSIS — Z7984 Long term (current) use of oral hypoglycemic drugs: Secondary | ICD-10-CM | POA: Insufficient documentation

## 2018-02-21 DIAGNOSIS — Y990 Civilian activity done for income or pay: Secondary | ICD-10-CM | POA: Insufficient documentation

## 2018-02-21 DIAGNOSIS — S161XXA Strain of muscle, fascia and tendon at neck level, initial encounter: Secondary | ICD-10-CM

## 2018-02-21 DIAGNOSIS — Z79899 Other long term (current) drug therapy: Secondary | ICD-10-CM | POA: Insufficient documentation

## 2018-02-21 DIAGNOSIS — S199XXA Unspecified injury of neck, initial encounter: Secondary | ICD-10-CM | POA: Diagnosis present

## 2018-02-21 DIAGNOSIS — Y929 Unspecified place or not applicable: Secondary | ICD-10-CM | POA: Diagnosis not present

## 2018-02-21 DIAGNOSIS — E119 Type 2 diabetes mellitus without complications: Secondary | ICD-10-CM | POA: Diagnosis not present

## 2018-02-21 DIAGNOSIS — X500XXA Overexertion from strenuous movement or load, initial encounter: Secondary | ICD-10-CM | POA: Insufficient documentation

## 2018-02-21 DIAGNOSIS — Y9389 Activity, other specified: Secondary | ICD-10-CM | POA: Insufficient documentation

## 2018-02-21 MED ORDER — IBUPROFEN 400 MG PO TABS
400.0000 mg | ORAL_TABLET | Freq: Once | ORAL | Status: AC
  Administered 2018-02-21: 400 mg via ORAL
  Filled 2018-02-21: qty 1

## 2018-02-21 NOTE — ED Notes (Signed)
Patient transported to X-ray 

## 2018-02-21 NOTE — ED Provider Notes (Signed)
[facility] EMERGENCY DEPARTMENT Provider Note   CSN: [id number] Arrival date & time: 02/21/18  2053     History   Chief Complaint Chief Complaint  Patient presents with  . Neck Pain    HPI Carlos Matthews is a 57 y.o. male.  HPI   57 year old male with a history of T2DM, GERD, hyperlipidemia, cervical spine fusion who presents emergency department today for evaluation of neck pain that began earlier today at work.  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  Reports that he had pain to the right side of his neck and had an intermittent tingling sensation that went down his right upper extremity.  Symptoms lasted for about 1 minute and resolved on their own.  Since then he has had pain to the right side of his neck.  Has not tried taking any medications for his symptoms.  Has a mild headache which feels consistent with his prior headaches.  States that he frequently gets headaches.  No lightheadedness, dizziness, vision changes, numbness or weakness to the arms or legs.  No chest pain or shortness of breath.  Patient is concerned that the plate in his neck is out of place.  Past Medical History:  Diagnosis Date  . Diabetes mellitus without complication (HCC)   . GERD (gastroesophageal reflux disease)   . Hyperlipidemia     Patient Active Problem List   Diagnosis Date Noted  . Microcytic anemia 09/21/2014  . Sleep disorder 09/17/2013  . Health care maintenance 11/16/2011  . Hyperlipidemia LDL goal < 100 11/16/2011  . Type 2 diabetes mellitus not at goal [facility]) 11/16/2011  . Obesity (BMI 30.0-34.9) 11/16/2011    Past Surgical History:  Procedure Laterality Date  . arm fracture     . FRACTURE SURGERY    . NECK SURGERY    . ROTATOR CUFF REPAIR    . TONSILLECTOMY    . VASECTOMY          Home Medications    Prior to Admission medications   Medication Sig Start Date End  Date Taking? Authorizing Provider  Acetaminophen-Aspirin Buffered (EXCEDRIN BACK & BODY) 250-250 MG tablet Take 1 tablet by mouth every 6 (six) hours as needed for pain.   Yes [provider]  multivitamin (ONE-A-DAY MEN'S) TABS tablet Take 1 tablet by mouth daily.   Yes [provider]  Semaglutide (OZEMPIC) 0.25 or 0.5 MG/DOSE SOPN Inject 0.5 mg into the skin every Sunday.   Yes [provider]  sildenafil (VIAGRA) 50 MG tablet Take 50 mg by mouth daily as needed for erectile dysfunction.   Yes [provider]  simvastatin (ZOCOR) 40 MG tablet Take 10 mg by mouth at bedtime. 01/01/18  Yes [provider]  XIGDUO XR 12-998 MG TB24 Take 1 tablet by mouth 2 (two) times daily. 12/19/17  Yes [provider]  zolpidem (AMBIEN) 10 MG tablet TAKE ONE-HALF TABLET BY MOUTH ONCE DAILY AT BEDTIME AS NEEDED FOR SLEEP Patient taking differently: Take 10 mg by mouth at bedtime as needed for sleep 04/13/16  Yes Ethelda Chick, MD  glipiZIDE (GLUCOTROL) 5 MG tablet TAKE ONE TABLET BY MOUTH TWICE DAILY BEFORE  MEAL(S)  OFFICE  VISIT  NEEDED  FOR  REFILLS Patient not taking: Reported on 02/21/2018 04/24/16   Shade Flood, MD  glucose blood test strip Use as instructed 07/14/13   Audria Nine,  Franki Cabot, MD  lisinopril (PRINIVIL,ZESTRIL) 5 MG tablet Take 1 tablet (5 mg total) by mouth daily. Patient not taking: Reported on 02/21/2018 11/24/15   Shade Flood, MD  metFORMIN (GLUCOPHAGE) 1000 MG tablet Take 1 tablet (1,000 mg total) by mouth 2 (two) times daily with a meal. Patient not taking: Reported on 02/21/2018 11/24/15   Shade Flood, MD  naproxen (NAPROSYN) 500 MG tablet Take 1 tablet (500 mg total) by mouth 2 (two) times daily. Patient not taking: Reported on 02/21/2018 09/27/15   Dowless, Lelon Mast Tripp, PA-C  simvastatin (ZOCOR) 20 MG tablet Take 0.5 tablets (10 mg total) by mouth daily at 6 PM. Patient not taking: Reported on 02/21/2018 11/24/15   Shade Flood, MD    Family History Family History  Problem Relation Age of Onset  . Diabetes Mother   . Kidney disease Mother   . Diabetes Father   . Hypertension Father   . Mental illness Father 32       Alzheimer's dementia  . Diabetes Brother   . Stroke Brother   . Early death Brother   . Diabetes Maternal Grandfather   . Diabetes Paternal Grandmother   . Diabetes Sister   . Lupus Sister     Social History Social History   Tobacco Use  . Smoking status: Never Smoker  . Smokeless tobacco: Never Used  Substance Use Topics  . Alcohol use: No    Alcohol/week: 0.0 oz  . Drug use: No     Allergies   Penicillins   Review of Systems Review of Systems  Constitutional: Negative for fever.  Musculoskeletal: Positive for neck pain.  Skin: Negative for wound.  Neurological: Positive for headaches. Negative for dizziness, weakness, light-headedness and numbness.    Physical Exam Updated Vital Signs BP 124/74 (BP Location: Right Arm)   Pulse 63   Temp 99 F (37.2 C) (Oral)   Resp 14   SpO2 100%   Physical Exam  Constitutional: He is oriented to person, place, and time. He appears well-developed and well-nourished. No distress.  Eyes: Conjunctivae are normal.  Cardiovascular: Normal rate, regular rhythm, normal heart sounds and intact distal pulses.  Pulmonary/Chest: Effort normal and breath sounds normal. He has no wheezes.  Abdominal: Soft. There is no tenderness.  Musculoskeletal:  No midline cervical, thoracic, or lumbar ttp. ttp along right right cervical paraspinous muscles. 5/5 strength to BUE and BLE.  Neurological: He is alert and oriented to person, place, and time. No cranial nerve deficit.  Skin: Skin is warm and dry. Capillary refill takes less than 2 seconds.  Psychiatric: He has a normal mood and affect.    ED Treatments / Results  Labs (all labs ordered are listed, but only abnormal results are displayed) Labs Reviewed - No data to  display  EKG None  Radiology Dg Cervical Spine Complete  Result Date: 02/21/2018 CLINICAL DATA:  Right neck pain extending down the right arm at the time of injury. EXAM: CERVICAL SPINE - COMPLETE 4+ VIEW COMPARISON:  02/12/2018 FINDINGS: Anterior plate and solid interbody fusion at the C5-6 level, without malalignment at this level or elsewhere in the cervical spine. Relative preservation of the remaining cervical intervertebral discs. No significant prevertebral soft tissue swelling. No significant osseous foraminal narrowing is appreciated. IMPRESSION: Solid interbody fusion at C5-6 without complicating feature. Preserved intervertebral discs elsewhere in the cervical spine without subluxation, fracture, or specific abnormality to explain the patient's current symptoms. Electronically Signed   By: Zollie Beckers  Ova FreshwaterLiebkemann M.D.   On: 02/21/2018 23:40    Procedures Procedures (including critical care time)  Medications Ordered in ED Medications  ibuprofen (ADVIL,MOTRIN) tablet 400 mg (400 mg Oral Given 02/21/18 2110)     Initial Impression / Assessment and Plan / ED Course  I have reviewed the triage vital signs and the nursing notes.  Pertinent labs & imaging results that were available during my care of the patient were reviewed by me and considered in my medical decision making (see chart for details).      Final Clinical Impressions(s) / ED Diagnoses   Final diagnoses:  Acute strain of neck muscle, initial encounter   Patient with right-sided neck pain that occurred after pulling subbing heavy earlier today.  Vital signs stable.  Afebrile.  No distress on exam.  Tenderness to the right sided cervical paraspinous muscles.  Patient concerned because he has a history of cervical spine fusion and he wants to make sure that the plate is in place in his neck.  X-ray of the cervical spine shows no acute changes.  Advised him to take Tylenol and Motrin at home for his symptoms.  Advised PCP  follow-up in 1 week for reevaluation and to return if he has any new or worsening symptoms in the meantime.  All questions answered and patient stable for discharge.  ED Discharge Orders    None       Karrie MeresCouture, Kenlee Maler S, [place]PA-C 02/22/18 0044    Charlynne PanderYao, David Hsienta, MD 02/22/18 [phone]

## 2018-02-21 NOTE — ED Notes (Signed)
See EDP assessment / note.   

## 2018-02-21 NOTE — Discharge Instructions (Signed)
Please follow up with your primary care provider within 5-7 days for re-evaluation of your symptoms. If you do not have a primary care provider, information for a healthcare clinic has been provided for you to make arrangements for follow up care. Please return to the emergency department for any new or worsening symptoms. ° °

## 2018-02-21 NOTE — ED Triage Notes (Signed)
Pt presents to ED for assessment of right neck pain since tweaking it today at work.  Hx of "a plate" in his neck.  Pt states some tingling and pain radiating down his right arm at the time of the injury, but denies at this time.

## 2018-02-22 NOTE — ED Notes (Signed)
See EDP assessment 

## 2018-02-22 NOTE — ED Notes (Signed)
Pt verbalizes understanding of d/c instructions. Pt ambulatory at d/c with all belongings.   

## 2018-03-04 ENCOUNTER — Ambulatory Visit
Admission: RE | Admit: 2018-03-04 | Discharge: 2018-03-04 | Disposition: A | Discharge status: Home / Self Care | Payer: BLUE CROSS/BLUE SHIELD | Source: Ambulatory Visit | Attending: Neurosurgery | Admitting: Neurosurgery

## 2018-03-04 DIAGNOSIS — M5412 Radiculopathy, cervical region: Secondary | ICD-10-CM

## 2018-10-23 ENCOUNTER — Encounter: Payer: Self-pay | Admitting: Gastroenterology

## 2019-01-24 ENCOUNTER — Encounter (HOSPITAL_COMMUNITY): Payer: Self-pay | Admitting: Emergency Medicine

## 2019-01-24 ENCOUNTER — Other Ambulatory Visit: Payer: Self-pay

## 2019-01-24 ENCOUNTER — Ambulatory Visit (HOSPITAL_COMMUNITY)
Admission: EM | Admit: 2019-01-24 | Discharge: 2019-01-24 | Disposition: A | Discharge status: Home / Self Care | Payer: BLUE CROSS/BLUE SHIELD | Attending: Family Medicine | Admitting: Family Medicine

## 2019-01-24 DIAGNOSIS — R682 Dry mouth, unspecified: Secondary | ICD-10-CM | POA: Diagnosis not present

## 2019-01-24 HISTORY — DX: Disorder of kidney and ureter, unspecified: N28.9

## 2019-01-24 MED ORDER — BIOTENE DRY MOUTH MT LIQD: 15.0000 mL | Freq: Three times a day (TID) | OROMUCOSAL | 3 refills | Status: DC | PRN

## 2019-01-24 NOTE — Discharge Instructions (Signed)
Try Biotene oral rinse 3 times daily as needed for dry mouth.  Also recommend sugar-free lozenges or sugar-free hard candy.  Continue to drink water 6-8, 8 ounce glasses a day are recommended. If no improvement follow-up with PCP.

## 2019-01-24 NOTE — ED Provider Notes (Signed)
[facility]    CSN: [id number] Arrival date & time: 01/24/19  1417      History   Chief Complaint Chief Complaint  Patient presents with  . Thrush    HPI Carlos Matthews is a 58 y.o. male.   HPI  Presents today with a complaint of white spots on tongue , dry mouth, and concern for thrush. Suffers from diabetes and reports blood sugars stable and most readings less than 130. Concern as his grandson had thrush and he noticed white spots on his tongue and throat a few days ago. Uncertain if the spots are still there today. Denies oral pain or pain with swallowing. Past Medical History:  Diagnosis Date  . Diabetes mellitus without complication (HCC)   . GERD (gastroesophageal reflux disease)   . Hyperlipidemia   . Renal disorder     Patient Active Problem List   Diagnosis Date Noted  . Microcytic anemia 09/21/2014  . Sleep disorder 09/17/2013  . Health care maintenance 11/16/2011  . Hyperlipidemia LDL goal < 100 11/16/2011  . Type 2 diabetes mellitus not at goal [facility](HCC) 11/16/2011  . Obesity (BMI 30.0-34.9) 11/16/2011    Past Surgical History:  Procedure Laterality Date  . arm fracture     . FRACTURE SURGERY    . NECK SURGERY    . ROTATOR CUFF REPAIR    . TONSILLECTOMY    . VASECTOMY         Home Medications    Prior to Admission medications   Medication Sig Start Date End Date Taking? Authorizing Provider  lisinopril (PRINIVIL,ZESTRIL) 5 MG tablet Take 1 tablet (5 mg total) by mouth daily. 11/24/15  Yes Shade FloodGreene, Jeffrey R, MD  Semaglutide [facility](OZEMPIC) 0.25 or 0.5 MG/DOSE SOPN Inject 0.5 mg into the skin every Sunday.   Yes [provider]  simvastatin (ZOCOR) 20 MG tablet Take 0.5 tablets (10 mg total) by mouth daily at 6 PM. 11/24/15  Yes Shade FloodGreene, Jeffrey R, MD  XIGDUO XR 12-998 MG TB24 Take 1 tablet by mouth 2 (two) times daily. 12/19/17  Yes [provider]  Acetaminophen-Aspirin Buffered (EXCEDRIN BACK & BODY) 250-250 MG tablet Take 1  tablet by mouth every 6 (six) hours as needed for pain.    [provider]  glipiZIDE (GLUCOTROL) 5 MG tablet TAKE ONE TABLET BY MOUTH TWICE DAILY BEFORE  MEAL(S)  OFFICE  VISIT  NEEDED  FOR  REFILLS Patient not taking: Reported on 02/21/2018 04/24/16   Shade FloodGreene, Jeffrey R, MD  glucose blood test strip Use as instructed 07/14/13   Maurice MarchMcPherson, Barbara B, MD  metFORMIN (GLUCOPHAGE) 1000 MG tablet Take 1 tablet (1,000 mg total) by mouth 2 (two) times daily with a meal. Patient not taking: Reported on 02/21/2018 11/24/15   Shade FloodGreene, Jeffrey R, MD  multivitamin (ONE-A-DAY MEN'S) TABS tablet Take 1 tablet by mouth daily.    [provider]  naproxen (NAPROSYN) 500 MG tablet Take 1 tablet (500 mg total) by mouth 2 (two) times daily. Patient not taking: Reported on 02/21/2018 09/27/15   Dowless, Lelon MastSamantha Tripp, PA-C  sildenafil (VIAGRA) 50 MG tablet Take 50 mg by mouth daily as needed for erectile dysfunction.    [provider]  simvastatin (ZOCOR) 40 MG tablet Take 10 mg by mouth at bedtime. 01/01/18   [provider]  zolpidem (AMBIEN) 10 MG tablet TAKE ONE-HALF TABLET BY MOUTH ONCE DAILY AT BEDTIME AS NEEDED FOR SLEEP Patient taking differently: Take 10 mg by mouth at bedtime as  needed for sleep 04/13/16   Ethelda ChickSmith, Kristi M, MD    Family History Family History  Problem Relation Age of Onset  . Diabetes Mother   . Kidney disease Mother   . Diabetes Father   . Hypertension Father   . Mental illness Father [id number]       Alzheimer's dementia  . Diabetes Brother   . Stroke Brother   . Early death Brother   . Diabetes Maternal Grandfather   . Diabetes Paternal Grandmother   . Diabetes Sister   . Lupus Sister     Social History Social History   Tobacco Use  . Smoking status: Never Smoker  . Smokeless tobacco: Never Used  Substance Use Topics  . Alcohol use: No    Alcohol/week: 0.0 standard drinks  . Drug use: No     Allergies   Penicillins   Review of Systems  Review of Systems Pertinent negatives listed in HPI Physical Exam Triage Vital Signs ED Triage Vitals  Enc Vitals Group     BP 01/24/19 1455 122/68     Pulse Rate 01/24/19 1455 84     Resp 01/24/19 1455 16     Temp 01/24/19 1455 98.7 F (37.1 C)     Temp Source 01/24/19 1455 Oral     SpO2 01/24/19 1455 99 %     Weight --      Height --      Head Circumference --      Peak Flow --      Pain Score 01/24/19 1451 0     Pain Loc --      Pain Edu? --      Excl. in GC? --    No data found.  Updated Vital Signs BP 122/68 (BP Location: Right Arm)   Pulse 84   Temp 98.7 F (37.1 C) (Oral)   Resp 16   SpO2 99%   Visual Acuity Right Eye Distance:   Left Eye Distance:   Bilateral Distance:    Right Eye Near:   Left Eye Near:    Bilateral Near:     Physical Exam General appearance: alert, well developed, well nourished, cooperative and in no distress Head: Normocephalic, without obvious abnormality, atraumatic Mouth: dry mouth, tongue pink, oral mucosa dry Respiratory: Respirations even and unlabored, normal respiratory rate Heart: rate and rhythm normal. No gallop or murmurs noted on exam  Abdomen: BS +, no distention, no rebound tenderness, or no mass Extremities: No gross deformities Skin: Skin color, texture, turgor normal. No rashes seen  Psych: Appropriate mood and affect. Neurologic: Mental status: Alert, oriented to person, place, and time, thought content appropriate.  UC Treatments / Results  Labs (all labs ordered are listed, but only abnormal results are displayed) Labs Reviewed - No data to display  EKG None  Radiology No results found.  Procedures Procedures (including critical care time)  Medications Ordered in UC Medications - No data to display  Initial Impression / Assessment and Plan / UC Course  I have reviewed the triage vital signs and the nursing notes.  Pertinent labs & imaging results that were available during my care of the  patient were reviewed by me and considered in my medical decision making (see chart for details).    Patient is negative of thrush. He is complaining of chronic dry mouth. Recommend hard sugar free candy and trial Biotene.  Final Clinical Impressions(s) / UC Diagnoses   Final diagnoses:  Dry mouth  Discharge Instructions     Try Biotene oral rinse 3 times daily as needed for dry mouth.  Also recommend sugar-free lozenges or sugar-free hard candy.  Continue to drink water 6-8, 8 ounce glasses a day are recommended. If no improvement follow-up with PCP.    ED Prescriptions    Medication Sig Dispense Auth. Provider   antiseptic oral rinse (BIOTENE) LIQD 15 mLs by Mouth Rinse route 3 (three) times daily as needed for dry mouth. 237 mL Scot Jun, FNP     Controlled Substance Prescriptions [place] Controlled Substance Registry consulted? Not Applicable   Scot Jun, [place] 01/24/19 2246

## 2019-01-24 NOTE — ED Triage Notes (Signed)
Patient reports grandson has thrush.  Patient is concerned because he has a white coating in mouth and some areas with white sponts

## 2019-02-16 ENCOUNTER — Ambulatory Visit (AMBULATORY_SURGERY_CENTER): Payer: Self-pay | Admitting: *Deleted

## 2019-02-16 ENCOUNTER — Other Ambulatory Visit: Payer: Self-pay

## 2019-02-16 VITALS — Ht 66.0 in | Wt 188.0 lb

## 2019-02-16 DIAGNOSIS — Z1211 Encounter for screening for malignant neoplasm of colon: Secondary | ICD-10-CM

## 2019-02-16 MED ORDER — PEG 3350-KCL-NA BICARB-NACL 420 G PO SOLR: 4000.0000 mL | Freq: Once | ORAL | 0 refills | Status: AC

## 2019-02-16 NOTE — Progress Notes (Signed)
No egg or soy allergy known to patient  No issues with past sedation with any surgeries  or procedures, no intubation problems  No diet pills per patient No home 02 use per patient  No blood thinners per patient  Pt denies issues with constipation  No A fib or A flutter  EMMI video sent to pt's e mail   Pt verified name, DOB, address and insurance during PV today. Pt mailed instruction packet to included paper to complete and mail back to [facility] with addressed and stamped envelope, Emmi video, copy of consent form to read and not return, and instructions.PV completed over the phone. Pt encouraged to call with questions or issues   Pt is aware that care partner will wait in the car during procedure; if they feel like they will be too hot to wait in the car; they may wait in the lobby.  We want them to wear a mask (we do not have any that we can provide them), practice social distancing, and we will check their temperatures when they get here.  I did remind patient that their care partner needs to stay in the parking lot the entire time. Pt will wear mask into building.  

## 2019-02-27 ENCOUNTER — Telehealth: Payer: Self-pay | Admitting: Gastroenterology

## 2019-02-27 NOTE — Telephone Encounter (Signed)

## 2019-03-02 ENCOUNTER — Encounter: Payer: Self-pay | Admitting: Gastroenterology

## 2019-03-02 ENCOUNTER — Other Ambulatory Visit: Payer: Self-pay

## 2019-03-02 ENCOUNTER — Ambulatory Visit (AMBULATORY_SURGERY_CENTER): Payer: BLUE CROSS/BLUE SHIELD | Admitting: Gastroenterology

## 2019-03-02 VITALS — BP 100/76 | HR 72 | Temp 98.3°F | Resp 20 | Ht 66.0 in | Wt 188.0 lb

## 2019-03-02 DIAGNOSIS — Z1211 Encounter for screening for malignant neoplasm of colon: Secondary | ICD-10-CM | POA: Diagnosis present

## 2019-03-02 MED ORDER — SODIUM CHLORIDE 0.9 % IV SOLN: 500.0000 mL | Freq: Once | INTRAVENOUS | Status: DC

## 2019-03-02 NOTE — Op Note (Signed)
Carlos Matthews Patient Name: Carlos Matthews Procedure Date: 03/02/2019 8:12 AM MRN: [id number] Endoscopist: Carlos Matthews , MD Age: 58 Referring MD:  Date of Birth: 10/01/1960 Gender: Male Account #: [id number] Procedure:                Colonoscopy Indications:              Screening for colorectal malignant neoplasm Medicines:                Monitored Anesthesia Care Procedure:                Pre-Anesthesia Assessment:                           - Prior to the procedure, a History and Physical                            was performed, and patient medications and                            allergies were reviewed. The patient's tolerance of                            previous anesthesia was also reviewed. The risks                            and benefits of the procedure and the sedation                            options and risks were discussed with the patient.                            All questions were answered, and informed consent                            was obtained. Prior Anticoagulants: The patient has                            taken no previous anticoagulant or antiplatelet                            agents. ASA Grade Assessment: II - A patient with                            mild systemic disease. After reviewing the risks                            and benefits, the patient was deemed in                            satisfactory condition to undergo the procedure.                           After obtaining informed consent, the colonoscope  was passed under direct vision. Throughout the                            procedure, the patient's blood pressure, pulse, and                            oxygen saturations were monitored continuously. The                            Colonoscope was introduced through the anus and                            advanced to the the cecum, identified by                            appendiceal orifice and  ileocecal valve. The                            colonoscopy was performed without difficulty. The                            patient tolerated the procedure well. The quality                            of the bowel preparation was adequate. The                            ileocecal valve, appendiceal orifice, and rectum                            were photographed. Scope In: 8:18:02 AM Scope Out: 8:28:29 AM Scope Withdrawal Time: 0 hours 6 minutes 44 seconds  Total Procedure Duration: 0 hours 10 minutes 27 seconds  Findings:                 The entire examined colon appeared normal on direct                            and retroflexion views. Complications:            No immediate complications. Estimated Blood Loss:     Estimated blood loss: none. Impression:               - The entire examined colon is normal on direct and                            retroflexion views.                           - No polyps or cancers. Recommendation:           - Patient has a contact number available for                            emergencies. The signs and symptoms of potential  delayed complications were discussed with the                            patient. Return to normal activities tomorrow.                            Written discharge instructions were provided to the                            patient.                           - Resume previous diet.                           - Continue present medications.                           - Repeat colonoscopy in 10 years for screening. Carlos Feeaniel P Carlos Shackleford, MD 03/02/2019 8:30:17 AM This report has been signed electronically.

## 2019-03-02 NOTE — Progress Notes (Signed)
Report to PACU, RN, vss, BBS= Clear.  

## 2019-03-02 NOTE — Progress Notes (Addendum)
[place] Monday, RN - VS   Pt reported he drank all of his Nulytely prep - and results was light brown liquid.  Per Dr. Ardis Hughs no enema will try to do exam. maw

## 2019-03-02 NOTE — Patient Instructions (Signed)
You had a normal colonoscopy with no biopsies taken.  You're good for another 10 years!  YOU HAD AN ENDOSCOPIC PROCEDURE TODAY AT [facility]:   Refer to the procedure report that was given to you for any specific questions about what was found during the examination.  If the procedure report does not answer your questions, please call your gastroenterologist to clarify.  If you requested that your care partner not be given the details of your procedure findings, then the procedure report has been included in a sealed envelope for you to review at your convenience later.  YOU SHOULD EXPECT: Some feelings of bloating in the abdomen. Passage of more gas than usual.  Walking can help get rid of the air that was put into your GI tract during the procedure and reduce the bloating. If you had a lower endoscopy (such as a colonoscopy or flexible sigmoidoscopy) you may notice spotting of blood in your stool or on the toilet paper. If you underwent a bowel prep for your procedure, you may not have a normal bowel movement for a few days.  Please Note:  You might notice some irritation and congestion in your nose or some drainage.  This is from the oxygen used during your procedure.  There is no need for concern and it should clear up in a day or so.  SYMPTOMS TO REPORT IMMEDIATELY:   Following lower endoscopy (colonoscopy or flexible sigmoidoscopy):  Excessive amounts of blood in the stool  Significant tenderness or worsening of abdominal pains  Swelling of the abdomen that is new, acute  Fever of 100F or higher  For urgent or emergent issues, a gastroenterologist can be reached at any hour by calling [phone].   DIET:  We do recommend a small meal at first, but then you may proceed to your regular diet.  Drink plenty of fluids but you should avoid alcoholic beverages for 24 hours.  ACTIVITY:  You should plan to take it easy for the rest of today and you should NOT DRIVE or use  heavy machinery until tomorrow (because of the sedation medicines used during the test).    FOLLOW UP: Our staff will call the number listed on your records 48-72 hours following your procedure to check on you and address any questions or concerns that you may have regarding the information given to you following your procedure. If we do not reach you, we will leave a message.  We will attempt to reach you two times.  During this call, we will ask if you have developed any symptoms of COVID 19. If you develop any symptoms (ie: fever, flu-like symptoms, shortness of breath, cough etc.) before then, please call [phone].  If you test positive for Covid 19 in the 2 weeks post procedure, please call and report this information to [place].    If any biopsies were taken you will be contacted by phone or by letter within the next 1-3 weeks.  Please call us at [phone] if you have not heard about the biopsies in 3 weeks.    SIGNATURES/CONFIDENTIALITY: You and/or your care partner have signed paperwork which will be entered into your electronic medical record.  These signatures attest to the fact that that the information above on your After Visit Summary has been reviewed and is understood.  Full responsibility of the confidentiality of this discharge information lies with you and/or your care-partner.

## 2019-03-04 ENCOUNTER — Telehealth: Payer: Self-pay

## 2019-03-04 NOTE — Telephone Encounter (Signed)
  Follow up Call-  Call back number 03/02/2019  Post procedure Call Back phone  # [phone] cell  Permission to leave phone message Yes  Some recent data might be hidden     No ID on voicemail No message left

## 2019-03-04 NOTE — Telephone Encounter (Signed)
  Follow up Call-  Call back number 03/02/2019  Post procedure Call Back phone  # [phone] cell  Permission to leave phone message Yes  Some recent data might be hidden     Patient questions:  Do you have a fever, pain , or abdominal swelling? No. Pain Score  0 *  Have you tolerated food without any problems? Yes.    Have you been able to return to your normal activities? Yes.    Do you have any questions about your discharge instructions: Diet   No. Medications  No. Follow up visit  No.  Do you have questions or concerns about your Care? No.  Actions: * If pain score is 4 or above: No action needed, pain <4.  1. Have you developed a fever since your procedure? no  2.   Have you had an respiratory symptoms (SOB or cough) since your procedure? no  3.   Have you tested positive for COVID 19 since your procedure no  4.   Have you had any family members/close contacts diagnosed with the COVID 19 since your procedure?  no   If yes to any of these questions please route to Joylene John, RN and Alphonsa Gin, Therapist, sports.

## 2020-04-01 ENCOUNTER — Encounter: Payer: Self-pay | Admitting: Podiatry

## 2020-04-01 ENCOUNTER — Other Ambulatory Visit: Payer: Self-pay

## 2020-04-01 ENCOUNTER — Ambulatory Visit: Payer: BLUE CROSS/BLUE SHIELD | Admitting: Podiatry

## 2020-04-01 DIAGNOSIS — E119 Type 2 diabetes mellitus without complications: Secondary | ICD-10-CM

## 2020-04-01 NOTE — Progress Notes (Signed)
This patient returns to my office for at risk foot care.  This patient requires this care by a professional since this patient will be at risk due to having medicine controlled diabetes.  Patient presents to the office for diabetic foot exam.  Patient is not having any pain related to his feet.   This patient presents for at risk foot care today.  General Appearance  Alert, conversant and in no acute stress.  Vascular  Dorsalis pedis  are palpable  bilaterally. Posterior tibial pulses are weakly palpable  Bilaterally.   Capillary return is within normal limits  bilaterally. Temperature is within normal limits  bilaterally.  Neurologic  Senn-Weinstein monofilament wire test within normal limits  bilaterally. Muscle power within normal limits bilaterally.  Nails Normal and painfree nails  B/L. No evidence of bacterial infection or drainage bilaterally.  Orthopedic  No limitations of motion  feet .  No crepitus or effusions noted.  No bony pathology or digital deformities noted.Mild dorsal lipping 1st MPJ  B/L.  Bony exostosis 1st MCJ right foot.  Skin  normotropic skin with no porokeratosis noted bilaterally.  No signs of infections or ulcers noted.     Diabetes with no complications.  IE.  Diabetic exam reveals no vascular or neurologic pathology.     Return office visit   prn                  Told patient to return for periodic foot care and evaluation due to potential at risk complications.   Helane Gunther DPM

## 2020-10-21 ENCOUNTER — Other Ambulatory Visit: Payer: Self-pay | Admitting: Internal Medicine

## 2020-10-21 DIAGNOSIS — R0989 Other specified symptoms and signs involving the circulatory and respiratory systems: Secondary | ICD-10-CM

## 2020-10-21 DIAGNOSIS — E049 Nontoxic goiter, unspecified: Secondary | ICD-10-CM

## 2020-10-31 ENCOUNTER — Ambulatory Visit
Admission: RE | Admit: 2020-10-31 | Discharge: 2020-10-31 | Disposition: A | Discharge status: Home / Self Care | Payer: BLUE CROSS/BLUE SHIELD | Source: Ambulatory Visit | Attending: Internal Medicine | Admitting: Internal Medicine

## 2020-10-31 DIAGNOSIS — E049 Nontoxic goiter, unspecified: Secondary | ICD-10-CM

## 2020-10-31 DIAGNOSIS — R0989 Other specified symptoms and signs involving the circulatory and respiratory systems: Secondary | ICD-10-CM

## 2020-11-02 ENCOUNTER — Other Ambulatory Visit: Payer: Self-pay | Admitting: Internal Medicine

## 2020-11-02 ENCOUNTER — Other Ambulatory Visit: Payer: Self-pay

## 2020-11-02 ENCOUNTER — Ambulatory Visit
Admission: RE | Admit: 2020-11-02 | Discharge: 2020-11-02 | Disposition: A | Discharge status: Home / Self Care | Payer: BLUE CROSS/BLUE SHIELD | Source: Ambulatory Visit | Attending: Internal Medicine | Admitting: Internal Medicine

## 2020-11-02 DIAGNOSIS — E041 Nontoxic single thyroid nodule: Secondary | ICD-10-CM

## 2020-11-02 MED ORDER — IOPAMIDOL (ISOVUE-300) INJECTION 61%
75.0000 mL | Freq: Once | INTRAVENOUS | Status: AC | PRN
  Administered 2020-11-02: 75 mL via INTRAVENOUS

## 2020-11-25 ENCOUNTER — Other Ambulatory Visit: Payer: Self-pay | Admitting: Internal Medicine

## 2020-11-25 DIAGNOSIS — R0989 Other specified symptoms and signs involving the circulatory and respiratory systems: Secondary | ICD-10-CM

## 2020-12-20 ENCOUNTER — Ambulatory Visit
Admission: RE | Admit: 2020-12-20 | Discharge: 2020-12-20 | Disposition: A | Discharge status: Home / Self Care | Payer: BLUE CROSS/BLUE SHIELD | Source: Ambulatory Visit | Attending: Internal Medicine | Admitting: Internal Medicine

## 2020-12-20 ENCOUNTER — Other Ambulatory Visit: Payer: Self-pay | Admitting: Internal Medicine

## 2020-12-20 DIAGNOSIS — R0989 Other specified symptoms and signs involving the circulatory and respiratory systems: Secondary | ICD-10-CM

## 2021-01-27 ENCOUNTER — Encounter: Payer: Self-pay | Admitting: Gastroenterology

## 2021-02-08 ENCOUNTER — Ambulatory Visit: Payer: BLUE CROSS/BLUE SHIELD | Admitting: Nurse Practitioner

## 2021-02-09 ENCOUNTER — Encounter: Payer: Self-pay | Admitting: Gastroenterology

## 2021-02-09 ENCOUNTER — Other Ambulatory Visit: Payer: Self-pay | Admitting: *Deleted

## 2021-02-09 ENCOUNTER — Other Ambulatory Visit (INDEPENDENT_AMBULATORY_CARE_PROVIDER_SITE_OTHER): Payer: BLUE CROSS/BLUE SHIELD

## 2021-02-09 ENCOUNTER — Ambulatory Visit: Payer: BLUE CROSS/BLUE SHIELD | Admitting: Gastroenterology

## 2021-02-09 VITALS — BP 140/80 | HR 77 | Ht 66.0 in | Wt 193.2 lb

## 2021-02-09 DIAGNOSIS — R748 Abnormal levels of other serum enzymes: Secondary | ICD-10-CM

## 2021-02-09 LAB — LIPASE: Lipase: 108 U/L — ABNORMAL HIGH (ref 11.0–59.0)

## 2021-02-09 LAB — AMYLASE: Amylase: 85 U/L (ref 27–131)

## 2021-02-09 NOTE — Progress Notes (Signed)
I agree with the above note, plan 

## 2021-02-09 NOTE — Patient Instructions (Signed)
Your provider has requested that you go to the basement level for lab work before leaving today. Press "B" on the elevator. The lab is located at the first door on the left as you exit the elevator.  If you are age 62 or older, your body mass index should be between 23-30. Your Body mass index is 31.18 kg/m. If this is out of the aforementioned range listed, please consider follow up with your Primary Care Provider.  If you are age 48 or younger, your body mass index should be between 19-25. Your Body mass index is 31.18 kg/m. If this is out of the aformentioned range listed, please consider follow up with your Primary Care Provider.   __________________________________________________________  The [facility] providers would like to encourage you to use [facility] to communicate with providers for non-urgent requests or questions.  Due to long hold times on the telephone, sending your provider a message by [facility] may be a faster and more efficient way to get a response.  Please allow 48 business hours for a response.  Please remember that this is for non-urgent requests.

## 2021-02-09 NOTE — Progress Notes (Signed)
02/09/2021 Carlos Matthews [id number] 10-Sep-1960   HISTORY OF PRESENT ILLNESS:  This is a 60 year old male who is a patient of Dr. Larae Grooms.  He is here today at the request of his PCP, Dr. Link Snuffer, for evaluation regarding elevated amylase and lipase levels.  He tells me that he had been on Ozempic for about 2.5 years and started having a lot of nausea and vomiting.  Was noted to have elevated amylase and lipase levels as well.  It appears that his Ozempic was discontinued in February 2022 at which time his lipase was 179 and his amylase was 138.  After discontinuing the Ozempic his nausea and vomiting immediately stopped.  Repeat lipase 6 weeks later was only down to 157.  He has no complaints of weight loss, abdominal pain, no further nausea or vomiting.  Has actually gained weight.   Past Medical History:  Diagnosis Date   Diabetes mellitus without complication (HCC)    Hyperlipidemia    Renal disorder    Pt denies   Past Surgical History:  Procedure Laterality Date   COLONOSCOPY     last 10-27-2008- normal    FRACTURE SURGERY Left    arm   hand fracture surgery Left    NECK SURGERY     x2   ROTATOR CUFF REPAIR Bilateral    TONSILLECTOMY     VASECTOMY      reports that he has never smoked. He has never used smokeless tobacco. He reports that he does not drink alcohol and does not use drugs. family history includes Diabetes in his brother, father, maternal grandfather, mother, paternal grandmother, and sister; Early death in his brother; Hypertension in his father; Kidney disease in his mother; Lupus in his sister; Mental illness (age of onset: 73) in his father; Stroke in his brother. Allergies  Allergen Reactions   Penicillins Swelling and Rash    Eyes swell shut Has patient had a PCN reaction causing immediate rash, facial/tongue/throat swelling, SOB or lightheadedness with hypotension: Yes Has patient had a PCN reaction causing severe rash involving mucus membranes or  skin necrosis: Unk Has patient had a PCN reaction that required hospitalization: Went to ER Has patient had a PCN reaction occurring within the last 10 years: No If all of the above answers are "NO", then may proceed with Cephalosporin use.       Outpatient Encounter Medications as of 02/09/2021  Medication Sig   Acetaminophen-Aspirin Buffered 250-250 MG tablet Take 1 tablet by mouth every 6 (six) hours as needed for pain.   glucose blood test strip Use as instructed   Insulin Degludec (TRESIBA) 100 UNIT/ML SOLN Inject 14 Units into the skin daily.   multivitamin (ONE-A-DAY MEN'S) TABS tablet Take 1 tablet by mouth daily.   Probiotic Product (PROBIOTIC-10 PO) Take by mouth daily.   sildenafil (VIAGRA) 50 MG tablet Take 50 mg by mouth daily as needed for erectile dysfunction.   simvastatin (ZOCOR) 10 MG tablet Take 10 mg by mouth at bedtime.   XIGDUO XR 12-998 MG TB24 Take 1 tablet by mouth 2 (two) times daily.   zolpidem (AMBIEN) 10 MG tablet TAKE ONE-HALF TABLET BY MOUTH ONCE DAILY AT BEDTIME AS NEEDED FOR SLEEP   [DISCONTINUED] antiseptic oral rinse (BIOTENE) LIQD 15 mLs by Mouth Rinse route 3 (three) times daily as needed for dry mouth. (Patient not taking: Reported on 02/09/2021)   [DISCONTINUED] OZEMPIC, 1 MG/DOSE, 4 MG/3ML SOPN  (Patient not taking: Reported on 02/09/2021)  No facility-administered encounter medications on file as of 02/09/2021.     REVIEW OF SYSTEMS  : All other systems reviewed and negative except where noted in the History of Present Illness.   PHYSICAL EXAM: BP 140/80   Pulse 77   Ht 5\' 6"  (1.676 m)   Wt 193 lb 3.2 oz (87.6 kg)   SpO2 98%   BMI 31.18 kg/m  General: Well developed AA male in no acute distress Head: Normocephalic and atraumatic Eyes:  Sclerae anicteric, conjunctiva pink. Ears: Normal auditory acuity Lungs: Clear throughout to auscultation; no W/R/R Heart: Regular rate and rhythm; no M/R/G. Abdomen: Soft, non-distended.  BS present.   Non-tender. Musculoskeletal: Symmetrical with no gross deformities  Skin: No lesions on visible extremities Extremities: No edema  Neurological: Alert oriented x 4, grossly non-focal Psychological:  Alert and cooperative. Normal mood and affect  ASSESSMENT AND PLAN: *60 year old male who had been on Ozempic and started having a lot of nausea and vomiting.  Was noted to have elevated amylase and lipase levels as well.  It appears that his Ozempic was discontinued in February 2022 at which time his lipase was 179 and his amylase was 138.  After discontinuing the Ozempic his nausea and vomiting immediately stopped.  Repeat lipase 6 weeks later was only down to 157.  He has no complaints of weight loss, abdominal pain, no further nausea or vomiting.  Being that the last was checked in April, I am going to recheck an amylase and lipase today.  We will see what those show.  Ozempic certainly has side effects of nausea, vomiting, and both amylase and lipase elevations.  May consider CT scan of the abdomen if those remain elevated despite being off the medication for the past several months.   CC:  May, MD

## 2021-02-14 ENCOUNTER — Ambulatory Visit (INDEPENDENT_AMBULATORY_CARE_PROVIDER_SITE_OTHER)
Admission: RE | Admit: 2021-02-14 | Discharge: 2021-02-14 | Disposition: A | Discharge status: Home / Self Care | Payer: BLUE CROSS/BLUE SHIELD | Source: Ambulatory Visit | Attending: Gastroenterology | Admitting: Gastroenterology

## 2021-02-14 ENCOUNTER — Other Ambulatory Visit: Payer: Self-pay

## 2021-02-14 DIAGNOSIS — R748 Abnormal levels of other serum enzymes: Secondary | ICD-10-CM | POA: Diagnosis not present

## 2021-02-14 MED ORDER — IOHEXOL 300 MG/ML  SOLN
100.0000 mL | Freq: Once | INTRAMUSCULAR | Status: AC | PRN
  Administered 2021-02-14: 100 mL via INTRAVENOUS

## 2021-02-17 ENCOUNTER — Other Ambulatory Visit: Payer: Self-pay

## 2021-02-17 DIAGNOSIS — R748 Abnormal levels of other serum enzymes: Secondary | ICD-10-CM

## 2021-04-21 ENCOUNTER — Other Ambulatory Visit (INDEPENDENT_AMBULATORY_CARE_PROVIDER_SITE_OTHER): Payer: BLUE CROSS/BLUE SHIELD

## 2021-04-21 DIAGNOSIS — R748 Abnormal levels of other serum enzymes: Secondary | ICD-10-CM

## 2021-04-21 LAB — LIPASE: Lipase: 48 U/L (ref 11.0–59.0)

## 2021-12-17 ENCOUNTER — Ambulatory Visit (HOSPITAL_COMMUNITY)
Admission: EM | Admit: 2021-12-17 | Discharge: 2021-12-18 | Disposition: A | Discharge status: Home / Self Care | Payer: BLUE CROSS/BLUE SHIELD | Attending: Emergency Medicine | Admitting: Emergency Medicine

## 2021-12-17 ENCOUNTER — Encounter (HOSPITAL_COMMUNITY): Payer: Self-pay | Admitting: Emergency Medicine

## 2021-12-17 DIAGNOSIS — K253 Acute gastric ulcer without hemorrhage or perforation: Secondary | ICD-10-CM

## 2021-12-17 DIAGNOSIS — Z833 Family history of diabetes mellitus: Secondary | ICD-10-CM | POA: Insufficient documentation

## 2021-12-17 DIAGNOSIS — Z20822 Contact with and (suspected) exposure to covid-19: Secondary | ICD-10-CM | POA: Diagnosis not present

## 2021-12-17 DIAGNOSIS — K449 Diaphragmatic hernia without obstruction or gangrene: Secondary | ICD-10-CM | POA: Diagnosis not present

## 2021-12-17 DIAGNOSIS — T18128A Food in esophagus causing other injury, initial encounter: Secondary | ICD-10-CM | POA: Diagnosis present

## 2021-12-17 DIAGNOSIS — K222 Esophageal obstruction: Secondary | ICD-10-CM | POA: Diagnosis not present

## 2021-12-17 DIAGNOSIS — K3189 Other diseases of stomach and duodenum: Secondary | ICD-10-CM | POA: Diagnosis not present

## 2021-12-17 DIAGNOSIS — R1319 Other dysphagia: Secondary | ICD-10-CM | POA: Diagnosis not present

## 2021-12-17 DIAGNOSIS — Z794 Long term (current) use of insulin: Secondary | ICD-10-CM | POA: Insufficient documentation

## 2021-12-17 DIAGNOSIS — K259 Gastric ulcer, unspecified as acute or chronic, without hemorrhage or perforation: Secondary | ICD-10-CM | POA: Insufficient documentation

## 2021-12-17 DIAGNOSIS — K209 Esophagitis, unspecified without bleeding: Secondary | ICD-10-CM | POA: Insufficient documentation

## 2021-12-17 DIAGNOSIS — X58XXXA Exposure to other specified factors, initial encounter: Secondary | ICD-10-CM | POA: Diagnosis not present

## 2021-12-17 DIAGNOSIS — W44F3XA Food entering into or through a natural orifice, initial encounter: Secondary | ICD-10-CM

## 2021-12-17 DIAGNOSIS — E119 Type 2 diabetes mellitus without complications: Secondary | ICD-10-CM | POA: Insufficient documentation

## 2021-12-17 NOTE — ED Triage Notes (Signed)
Pt reports feeling like something is stuck in his chest since eating potato skins about 1p. He is hiccuping frequently. Denies SOB. Maintaining his secretions.  ?

## 2021-12-18 ENCOUNTER — Emergency Department (HOSPITAL_COMMUNITY): Payer: BLUE CROSS/BLUE SHIELD | Admitting: Certified Registered Nurse Anesthetist

## 2021-12-18 ENCOUNTER — Telehealth: Payer: Self-pay

## 2021-12-18 ENCOUNTER — Encounter (HOSPITAL_COMMUNITY): Payer: Self-pay

## 2021-12-18 ENCOUNTER — Encounter (HOSPITAL_COMMUNITY)
Admission: EM | Disposition: A | Discharge status: Home / Self Care | Payer: Self-pay | Source: Home / Self Care | Attending: Emergency Medicine

## 2021-12-18 ENCOUNTER — Emergency Department (HOSPITAL_COMMUNITY): Payer: BLUE CROSS/BLUE SHIELD

## 2021-12-18 DIAGNOSIS — T18128A Food in esophagus causing other injury, initial encounter: Secondary | ICD-10-CM

## 2021-12-18 DIAGNOSIS — K253 Acute gastric ulcer without hemorrhage or perforation: Secondary | ICD-10-CM

## 2021-12-18 DIAGNOSIS — W44F3XA Food entering into or through a natural orifice, initial encounter: Secondary | ICD-10-CM

## 2021-12-18 DIAGNOSIS — R1319 Other dysphagia: Secondary | ICD-10-CM

## 2021-12-18 HISTORY — PX: BIOPSY: SHX5522

## 2021-12-18 HISTORY — PX: ESOPHAGOGASTRODUODENOSCOPY: SHX5428

## 2021-12-18 LAB — CBC
HCT: 53.7 % — ABNORMAL HIGH (ref 39.0–52.0)
Hemoglobin: 17.9 g/dL — ABNORMAL HIGH (ref 13.0–17.0)
MCH: 28.4 pg (ref 26.0–34.0)
MCHC: 33.3 g/dL (ref 30.0–36.0)
MCV: 85.2 fL (ref 80.0–100.0)
Platelets: 212 10*3/uL (ref 150–400)
RBC: 6.3 MIL/uL — ABNORMAL HIGH (ref 4.22–5.81)
RDW: 14.2 % (ref 11.5–15.5)
WBC: 5.4 10*3/uL (ref 4.0–10.5)
nRBC: 0 % (ref 0.0–0.2)

## 2021-12-18 LAB — BASIC METABOLIC PANEL
Anion gap: 9 (ref 5–15)
BUN: 12 mg/dL (ref 8–23)
CO2: 28 mmol/L (ref 22–32)
Calcium: 9.6 mg/dL (ref 8.9–10.3)
Chloride: 103 mmol/L (ref 98–111)
Creatinine, Ser: 1.1 mg/dL (ref 0.61–1.24)
GFR, Estimated: >60 mL/min (ref >60)
Glucose, Bld: 191 mg/dL — ABNORMAL HIGH (ref 70–99)
Potassium: 3.9 mmol/L (ref 3.5–5.1)
Sodium: 140 mmol/L (ref 135–145)

## 2021-12-18 LAB — RESP PANEL BY RT-PCR (FLU A&B, COVID) ARPGX2
Influenza A by PCR: NEGATIVE
Influenza B by PCR: NEGATIVE
SARS Coronavirus 2 by RT PCR: NEGATIVE

## 2021-12-18 LAB — GLUCOSE, CAPILLARY: Glucose-Capillary: 150 mg/dL — ABNORMAL HIGH (ref 70–99)

## 2021-12-18 SURGERY — EGD (ESOPHAGOGASTRODUODENOSCOPY)
Anesthesia: Monitor Anesthesia Care

## 2021-12-18 SURGERY — EGD (ESOPHAGOGASTRODUODENOSCOPY)
Anesthesia: General

## 2021-12-18 MED ORDER — GLUCAGON HCL RDNA (DIAGNOSTIC) 1 MG IJ SOLR
2.0000 mg | Freq: Once | INTRAMUSCULAR | Status: AC
  Administered 2021-12-18: 2 mg via INTRAVENOUS
  Filled 2021-12-18: qty 2

## 2021-12-18 MED ORDER — HYDROMORPHONE HCL 1 MG/ML IJ SOLN
1.0000 mg | Freq: Once | INTRAMUSCULAR | Status: AC
  Administered 2021-12-18: 1 mg via INTRAVENOUS
  Filled 2021-12-18: qty 1

## 2021-12-18 MED ORDER — HYDROMORPHONE HCL 1 MG/ML IJ SOLN
1.0000 mg | INTRAMUSCULAR | Status: AC | PRN
  Administered 2021-12-18 (×2): 1 mg via INTRAVENOUS
  Filled 2021-12-18 (×2): qty 1

## 2021-12-18 MED ORDER — DEXAMETHASONE SODIUM PHOSPHATE 10 MG/ML IJ SOLN
INTRAMUSCULAR | Status: DC | PRN
  Administered 2021-12-18: 5 mg via INTRAVENOUS

## 2021-12-18 MED ORDER — FENTANYL CITRATE (PF) 100 MCG/2ML IJ SOLN
INTRAMUSCULAR | Status: AC
  Filled 2021-12-18: qty 2

## 2021-12-18 MED ORDER — ONDANSETRON HCL 4 MG/2ML IJ SOLN
4.0000 mg | Freq: Once | INTRAMUSCULAR | Status: AC
  Administered 2021-12-18: 4 mg via INTRAVENOUS
  Filled 2021-12-18: qty 2

## 2021-12-18 MED ORDER — SODIUM CHLORIDE 0.9 % IV BOLUS
1000.0000 mL | Freq: Once | INTRAVENOUS | Status: AC
  Administered 2021-12-18: 1000 mL via INTRAVENOUS

## 2021-12-18 MED ORDER — SUCCINYLCHOLINE CHLORIDE 200 MG/10ML IV SOSY
PREFILLED_SYRINGE | INTRAVENOUS | Status: DC | PRN
  Administered 2021-12-18: 160 mg via INTRAVENOUS

## 2021-12-18 MED ORDER — LIDOCAINE HCL (CARDIAC) PF 100 MG/5ML IV SOSY
PREFILLED_SYRINGE | INTRAVENOUS | Status: DC | PRN
  Administered 2021-12-18: 20 mg via INTRAVENOUS

## 2021-12-18 MED ORDER — PROPOFOL 10 MG/ML IV BOLUS
INTRAVENOUS | Status: AC
Start: 2021-12-18 — End: ?
  Filled 2021-12-18: qty 20

## 2021-12-18 MED ORDER — PANTOPRAZOLE SODIUM 40 MG PO TBEC: 40.0000 mg | DELAYED_RELEASE_TABLET | Freq: Every day | ORAL | 11 refills | Status: DC

## 2021-12-18 MED ORDER — PROPOFOL 10 MG/ML IV BOLUS
INTRAVENOUS | Status: DC | PRN
  Administered 2021-12-18: 200 mg via INTRAVENOUS

## 2021-12-18 MED ORDER — FENTANYL CITRATE (PF) 100 MCG/2ML IJ SOLN
INTRAMUSCULAR | Status: DC | PRN
Start: 2021-12-18 — End: 2021-12-18
  Administered 2021-12-18: 100 ug via INTRAVENOUS

## 2021-12-18 MED ORDER — ONDANSETRON HCL 4 MG/2ML IJ SOLN
INTRAMUSCULAR | Status: DC | PRN
Start: 2021-12-18 — End: 2021-12-18
  Administered 2021-12-18: 4 mg via INTRAVENOUS

## 2021-12-18 MED ORDER — SODIUM CHLORIDE 0.9 % IV SOLN: INTRAVENOUS | Status: DC

## 2021-12-18 MED ORDER — LACTATED RINGERS IV SOLN: INTRAVENOUS | Status: DC | PRN

## 2021-12-18 NOTE — Anesthesia Preprocedure Evaluation (Signed)
Anesthesia Evaluation  ?Patient identified by MRN, date of birth, ID band ?Patient awake ? ? ? ?Reviewed: ?Allergy & Precautions, NPO status , Patient's Chart, lab work & pertinent test results ? ?History of Anesthesia Complications ?Negative for: history of anesthetic complications ? ?Airway ?Mallampati: I ? ?TM Distance: >3 FB ?Neck ROM: Full ? ? ? Dental ? ?(+) Dental Advisory Given ?  ?Pulmonary ?neg pulmonary ROS,  ?  ?breath sounds clear to auscultation ? ? ? ? ? ? Cardiovascular ?negative cardio ROS ? ? ?Rhythm:Regular Rate:Normal ? ? ?  ?Neuro/Psych ?negative neurological ROS ?   ? GI/Hepatic ?negative GI ROS, Neg liver ROS,   ?Endo/Other  ?diabetes (glu 191), Insulin Dependent ? Renal/GU ?negative Renal ROS  ? ?  ?Musculoskeletal ? ? Abdominal ?  ?Peds ? Hematology ?negative hematology ROS ?(+)   ?Anesthesia Other Findings ? ? Reproductive/Obstetrics ? ?  ? ? ? ? ? ? ? ? ? ? ? ? ? ?  ?  ? ? ? ? ? ? ? ? ?Anesthesia Physical ?Anesthesia Plan ? ?ASA: 3 ? ?Anesthesia Plan: General  ? ?Post-op Pain Management: Minimal or no pain anticipated  ? ?Induction: Intravenous ? ?PONV Risk Score and Plan: 2 and Ondansetron and Dexamethasone ? ?Airway Management Planned: Oral ETT ? ?Additional Equipment: None ? ?Intra-op Plan:  ? ?Post-operative Plan: Extubation in OR ? ?Informed Consent: I have reviewed the patients History and Physical, chart, labs and discussed the procedure including the risks, benefits and alternatives for the proposed anesthesia with the patient or authorized representative who has indicated his/her understanding and acceptance.  ? ? ? ?Dental advisory given ? ?Plan Discussed with: CRNA and Surgeon ? ?Anesthesia Plan Comments:   ? ? ? ? ? ? ?Anesthesia Quick Evaluation ? ?

## 2021-12-18 NOTE — Anesthesia Postprocedure Evaluation (Signed)
Anesthesia Post Note ? ?Patient: Carlos Matthews ? ?Procedure(s) Performed: ESOPHAGOGASTRODUODENOSCOPY (EGD) ?BIOPSY ? ?  ? ?Patient location during evaluation: Endoscopy ?Anesthesia Type: General ?Level of consciousness: awake and alert, patient cooperative and oriented ?Pain management: pain level controlled ?Vital Signs Assessment: post-procedure vital signs reviewed and stable ?Respiratory status: spontaneous breathing, nonlabored ventilation and respiratory function stable ?Cardiovascular status: blood pressure returned to baseline and stable ?Postop Assessment: no apparent nausea or vomiting ?Anesthetic complications: no ? ? ?No notable events documented. ? ?Last Vitals:  ?Vitals:  ? 12/18/21 0740 12/18/21 0750  ?BP: 110/67 102/72  ?Pulse: 72 74  ?Resp: 16 15  ?Temp:    ?SpO2: 96% 94%  ?  ?Last Pain:  ?Vitals:  ? 12/18/21 0750  ?TempSrc:   ?PainSc: 0-No pain  ? ? ?  ?  ?  ?  ?  ?  ? ?Maze Corniel,E. Ivianna Notch ? ? ? ? ?

## 2021-12-18 NOTE — Telephone Encounter (Signed)
Prescription has been sent to the pharmacy.   ?ROV has been made for 01/26/22 at 930 am with Dr Christella Hartigan.  ?Letter mailed.  ?

## 2021-12-18 NOTE — Transfer of Care (Signed)
Immediate Anesthesia Transfer of Care Note ? ?Patient: Carlos Matthews ? ?Procedure(s) Performed: ESOPHAGOGASTRODUODENOSCOPY (EGD) ?BIOPSY ? ?Patient Location: Endoscopy Unit ? ?Anesthesia Type:General ? ?Level of Consciousness: drowsy ? ?Airway & Oxygen Therapy: Patient Spontanous Breathing ? ?Post-op Assessment: Report given to RN and Post -op Vital signs reviewed and stable ? ?Post vital signs: Reviewed and stable ? ?Last Vitals:  ?Vitals Value Taken Time  ?BP 114/63 12/18/21 0730  ?Temp    ?Pulse 72 12/18/21 0730  ?Resp 17 12/18/21 0730  ?SpO2 94 % 12/18/21 0730  ?Vitals shown include unvalidated device data. ? ?Last Pain:  ?Vitals:  ? 12/18/21 0641  ?TempSrc: Temporal  ?PainSc: 0-No pain  ?   ? ?  ? ?Complications: No notable events documented. ?

## 2021-12-18 NOTE — ED Provider Notes (Signed)
?[facility]-EMERGENCY DEPT ?[facility] Emergency Department ?Provider Note ?MRN:  [id number]  ?Arrival date & time: 12/18/21    ? ?Chief Complaint   ?Food Bolus ?  ?History of Present Illness   ?Carlos Matthews is a 61 y.o. year-old male with a history of diabetes presenting to the ED with chief complaint of food bolus. ? ?Patient ate some potato skins at about noon and since then has been having a sensation that something is stuck in his upper chest.  Frequent hiccups, vomiting.  Cannot drink anything. ? ?Review of Systems  ?A thorough review of systems was obtained and all systems are negative except as noted in the HPI and PMH.  ? ?Patient's Health History   ? ?Past Medical History:  ?Diagnosis Date  ? Diabetes mellitus without complication (HCC)   ? Hyperlipidemia   ? Renal disorder   ? Pt denies  ?  ?Past Surgical History:  ?Procedure Laterality Date  ? COLONOSCOPY    ? last 10-27-2008- normal   ? FRACTURE SURGERY Left   ? arm  ? hand fracture surgery Left   ? NECK SURGERY    ? x2  ? ROTATOR CUFF REPAIR Bilateral   ? TONSILLECTOMY    ? VASECTOMY    ?  ?Family History  ?Problem Relation Age of Onset  ? Diabetes Mother   ? Kidney disease Mother   ? Diabetes Father   ? Hypertension Father   ? Mental illness Father 33  ?     Alzheimer's dementia  ? Diabetes Brother   ? Stroke Brother   ? Early death Brother   ? Diabetes Maternal Grandfather   ? Diabetes Paternal Grandmother   ? Diabetes Sister   ? Lupus Sister   ? Colon polyps Neg Hx   ? Colon cancer Neg Hx   ? Esophageal cancer Neg Hx   ? Rectal cancer Neg Hx   ? Stomach cancer Neg Hx   ?  ?Social History  ? ?Socioeconomic History  ? Marital status: Married  ?  Spouse name: Not on file  ? Number of children: Not on file  ? Years of education: Not on file  ? Highest education level: Not on file  ?Occupational History  ? Not on file  ?Tobacco Use  ? Smoking status: Never  ? Smokeless tobacco: Never  ?Vaping Use  ? Vaping Use: Never used  ?Substance and Sexual  Activity  ? Alcohol use: No  ?  Alcohol/week: 0.0 standard drinks  ? Drug use: No  ? Sexual activity: Not on file  ?Other Topics Concern  ? Not on file  ?Social History Narrative  ? Marital status:  Married x 27 years  ?    Children: 4 children; 3 grandchildren  ?    Employment:  [employer] driver x 5 years; truck driver x 36 years  ?    Tobacco: none  ?    Alcohol: none  ?    Exercise: 4 days per week  ?   ? ?Social Determinants of Health  ? ?Financial Resource Strain: Not on file  ?Food Insecurity: Not on file  ?Transportation Needs: Not on file  ?Physical Activity: Not on file  ?Stress: Not on file  ?Social Connections: Not on file  ?Intimate Partner Violence: Not on file  ?  ? ?Physical Exam  ? ?Vitals:  ? 12/18/21 0330 12/18/21 0415  ?BP: 126/70 121/68  ?Pulse: 78 65  ?Resp: 13 14  ?Temp:    ?SpO2:  99% 97%  ?  ?CONSTITUTIONAL: Well-appearing, actively retching ?NEURO/PSYCH:  Alert and oriented x 3, no focal deficits ?EYES:  eyes equal and reactive ?ENT/NECK:  no LAD, no JVD ?CARDIO: Regular rate, well-perfused, normal S1 and S2 ?PULM:  CTAB no wheezing or rhonchi ?GI/GU:  non-distended, non-tender ?MSK/SPINE:  No gross deformities, no edema ?SKIN:  no rash, atraumatic ? ? ?*Additional and/or pertinent findings included in MDM below ? ?Diagnostic and Interventional Summary  ? ? EKG Interpretation ? ?Date/Time:  Monday Dec 18 2021 01:01:57 EDT ?Ventricular Rate:  69 ?PR Interval:  139 ?QRS Duration: 74 ?QT Interval:  368 ?QTC Calculation: 395 ?R Axis:   43 ?Text Interpretation: Sinus rhythm Borderline T wave abnormalities Confirmed by Kennis CarinaBero, Shaina Gullatt [phone]([id number]) on 12/18/2021 1:56:09 AM ?  ? ?  ? ?Labs Reviewed  ?CBC - Abnormal; Notable for the following components:  ?    Result Value  ? RBC 6.30 (*)   ? Hemoglobin 17.9 (*)   ? HCT 53.7 (*)   ? All other components within normal limits  ?BASIC METABOLIC PANEL - Abnormal; Notable for the following components:  ? Glucose, Bld 191 (*)   ? All other components within  normal limits  ?RESP PANEL BY RT-PCR (FLU A&B, COVID) ARPGX2  ?  ?DG Chest Port 1 View  ?Final Result  ?  ?  ?Medications  ?HYDROmorphone (DILAUDID) injection 1 mg (1 mg Intravenous Given 12/18/21 0419)  ?glucagon (human recombinant) (GLUCAGEN) injection 2 mg (2 mg Intravenous Given 12/18/21 0200)  ?sodium chloride 0.9 % bolus 1,000 mL (0 mLs Intravenous Stopped 12/18/21 0313)  ?ondansetron [facility](ZOFRAN) injection 4 mg (4 mg Intravenous Given 12/18/21 0210)  ?HYDROmorphone (DILAUDID) injection 1 mg (1 mg Intravenous Given 12/18/21 0313)  ?  ? ?Procedures  /  Critical Care ?.Critical Care ?Performed by: Sabas SousBero, Tressia Labrum M, MD ?Authorized by: Sabas SousBero, Frankie Scipio M, MD  ? ?Critical care provider statement:  ?  Critical care time (minutes):  35 ?  Critical care was necessary to treat or prevent imminent or life-threatening deterioration of the following conditions: esophageal food impaction. ?  Critical care was time spent personally by me on the following activities:  Development of treatment plan with patient or surrogate, discussions with consultants, evaluation of patient's response to treatment, examination of patient, ordering and review of laboratory studies, ordering and review of radiographic studies, ordering and performing treatments and interventions, pulse oximetry, re-evaluation of patient's condition and review of old charts ? ?ED Course and Medical Decision Making  ?Initial Impression and Ddx ?Suspect esophageal food bolus, will trial glucagon, suspect will need GI procedural intervention. ? ?Past medical/surgical history that increases complexity of ED encounter: Diabetes ? ?Interpretation of Diagnostics ?I personally reviewed the EKG and my interpretation is as follows: Sinus rhythm ?   ?Labs reveal some hemoconcentration in the setting of dehydration, otherwise no significant blood count or electrolyte disturbance. ? ?Patient Reassessment and Ultimate Disposition/Management ?Patient without improvement after glucagon,  still unable to tolerate p.o.  [facility], plan is for procedural intervention first thing in the morning.  Providing patient with Dilaudid as needed for discomfort. ? ?Patient management required discussion with the following services or consulting groups:  Gastroenterology ? ?Complexity of Problems Addressed ?Acute illness or injury that poses threat of life of bodily function ? ?Additional Data Reviewed and Analyzed ?Further history obtained from: ?Further history from spouse/family member ? ?Additional Factors Impacting ED Encounter Risk ?Use of parenteral controlled substances ? ?Casimiro NeedleMichael  Randol Kern, MD ?[facility] Emergency Medicine ?[facility] [email or web address] ? ?Final Clinical Impressions(s) / ED Diagnoses  ? ?  ICD-10-CM   ?1. Food impaction of esophagus, initial encounter  T18.128A   ?  ?  ?ED Discharge Orders   ? ? None  ? ?  ?  ? ?Discharge Instructions Discussed with and Provided to Patient:  ? ?Discharge Instructions   ?None ?  ? ?  ?Sabas Sous, MD ?12/18/21 [phone] ? ?

## 2021-12-18 NOTE — Telephone Encounter (Signed)
-----   Message from Hilarie Fredrickson, MD sent at 12/18/2021  7:43 AM EDT ----- ?Connie Lasater, ?Patient of Dr. Christella Hartigan.  I just took care of a food impaction.  He needs the following: ?1.  Please prescribe pantoprazole 40 mg daily; #30; 11 refills.  Call this into his pharmacy ?2.  Arrange office follow-up with Dr. Christella Hartigan in a few weeks.  The patient will need repeat EGD with esophageal dilation.  He has lots of questions about lots of things.  He would benefit from an office visit prior to setting up his procedure. ?Thanks, ?Dr. Marina Goodell ? ?

## 2021-12-18 NOTE — Op Note (Signed)
[facility] ?Patient Name: Carlos Matthews ?Procedure Date: 12/18/2021 ?MRN: [id number] ?Attending MD: Wilhemina Bonito. Marina Goodell , MD ?Date of Birth: 1960/11/09 ?CSN: [id number] ?Age: 61 ?Admit Type: Outpatient ?Procedure:                Upper GI endoscopy with relief of food impaction;  ?                          with biopsies ?Indications:              Foreign body in the esophagus ?Providers:                Wilhemina Bonito. Marina Goodell, MD, Adolph Pollack, RN, Ethelene Browns  ?                          Cheryll Dessert, Technician ?Referring MD:              ?Medicines:                Monitored Anesthesia Care, General Anesthesia ?Complications:            No immediate complications. ?Estimated Blood Loss:     Estimated blood loss: none. ?Procedure:                Pre-Anesthesia Assessment: ?                          - Prior to the procedure, a History and Physical  ?                          was performed, and patient medications and  ?                          allergies were reviewed. The patient's tolerance of  ?                          previous anesthesia was also reviewed. The risks  ?                          and benefits of the procedure and the sedation  ?                          options and risks were discussed with the patient.  ?                          All questions were answered, and informed consent  ?                          was obtained. Prior Anticoagulants: The patient has  ?                          taken no previous anticoagulant or antiplatelet  ?                          agents. ASA Grade Assessment: II - A patient with  ?  mild systemic disease. After reviewing the risks  ?                          and benefits, the patient was deemed in  ?                          satisfactory condition to undergo the procedure. ?                          After obtaining informed consent, the endoscope was  ?                          passed under direct vision. Throughout the  ?                           procedure, the patient's blood pressure, pulse, and  ?                          oxygen saturations were monitored continuously. The  ?                          [id number]-[id number] ([id number] Olympus endoscope was introduced  ?                          through the mouth, and advanced to the second part  ?                          of duodenum. The upper GI endoscopy was  ?                          accomplished without difficulty. The patient  ?                          tolerated the procedure well. ?Scope In: ?Scope Out: ?Findings: ?     The esophagus revealed a formed food bolus in the distal esophagus. This  ?     was gently advanced into the stomach without difficulty. Beneath, at 40  ?     cm, was acute esophagitis and benign stricture formation.. ?     The stomach revealed several small prepyloric ulcers. Biopsies were  ?     taken with a cold forceps for histology. ?     The examined duodenum was normal. ?     The cardia and gastric fundus were normal on retroflexion, save small  ?     hiatal hernia. ?Impression:               1. Acute food impaction status post endoscopic  ?                          removal ?                          2. Esophagitis and benign esophageal stricture ?                          3. Small prepyloric ulcers.  Status post biopsies. ?Moderate Sedation: ?     none ?Recommendation:           1. Patient has a contact number available for  ?                          emergencies. The signs and symptoms of potential  ?                          delayed complications were discussed with the  ?                          patient. Return to normal activities tomorrow.  ?                          Written discharge instructions were provided to the  ?                          patient. ?                          2. Clear liquids for 24 hours and soft foods. Be  ?                          careful with meats and breads.. ?                          3. Continue present medications. ?                          4. Await  pathology results. ?                          5. Prescribe pantoprazole 40 mg daily; #30; 11  ?                          refills. Take 1 daily. Dr. Christella Hartigan nurse will call  ?                          this into your pharmacy ?                          6. Office follow-up with Dr. Christella Hartigan in a few weeks.  ?                          His nurse will assist with that appointment ?Procedure Code(s):        --- Professional --- ?                          [phone], Esophagogastroduodenoscopy, flexible,  ?                          transoral; with biopsy, single or multiple ?Diagnosis Code(s):        --- Professional --- ?  T18.108A, Unspecified foreign body in esophagus  ?                          causing other injury, initial encounter ?CPT copyright 2019 American Medical Association. All rights reserved. ?The codes documented in this report are preliminary and upon coder review may  ?be revised to meet current compliance requirements. ?Wilhemina Bonito. Marina Goodell, MD ?12/18/2021 7:39:36 AM ?This report has been signed electronically. ?Number of Addenda: 0 ?

## 2021-12-18 NOTE — Discharge Instructions (Signed)
YOU HAD AN ENDOSCOPIC PROCEDURE TODAY: Refer to the procedure report and other information in the discharge instructions given to you for any specific questions about what was found during the examination. If this information does not answer your questions, please call [place] office at [phone] to clarify.   YOU SHOULD EXPECT: Some feelings of bloating in the abdomen. Passage of more gas than usual. Walking can help get rid of the air that was put into your GI tract during the procedure and reduce the bloating. If you had a lower endoscopy (such as a colonoscopy or flexible sigmoidoscopy) you may notice spotting of blood in your stool or on the toilet paper. Some abdominal soreness may be present for a day or two, also.  DIET: Your first meal following the procedure should be a light meal and then it is ok to progress to your normal diet. A half-sandwich or bowl of soup is an example of a good first meal. Heavy or fried foods are harder to digest and may make you feel nauseous or bloated. Drink plenty of fluids but you should avoid alcoholic beverages for 24 hours. If you had a esophageal dilation, please see attached instructions for diet.    ACTIVITY: Your care partner should take you home directly after the procedure. You should plan to take it easy, moving slowly for the rest of the day. You can resume normal activity the day after the procedure however YOU SHOULD NOT DRIVE, use power tools, machinery or perform tasks that involve climbing or major physical exertion for 24 hours (because of the sedation medicines used during the test).   SYMPTOMS TO REPORT IMMEDIATELY: A gastroenterologist can be reached at any hour. Please call [phone]  for any of the following symptoms:   Following upper endoscopy (EGD, EUS, ERCP, esophageal dilation) Vomiting of blood or coffee ground material  New, significant abdominal pain  New, significant chest pain or pain under the shoulder blades  Painful or  persistently difficult swallowing  New shortness of breath  Black, tarry-looking or red, bloody stools  FOLLOW UP:  If any biopsies were taken you will be contacted by phone or by letter within the next 1-3 weeks. Call [phone]  if you have not heard about the biopsies in 3 weeks.  Please also call with any specific questions about appointments or follow up tests.  

## 2021-12-18 NOTE — Anesthesia Procedure Notes (Signed)
Procedure Name: Intubation ?Date/Time: 12/18/2021 7:15 AM ?Performed by: Raenette Rover, CRNA ?Pre-anesthesia Checklist: Patient identified, Emergency Drugs available, Suction available and Patient being monitored ?Patient Re-evaluated:Patient Re-evaluated prior to induction ?Oxygen Delivery Method: Circle system utilized ?Preoxygenation: Pre-oxygenation with 100% oxygen ?Induction Type: IV induction, Rapid sequence and Cricoid Pressure applied ?Laryngoscope Size: Mac and 4 ?Grade View: Grade I ?Tube type: Oral ?Tube size: 7.5 mm ?Number of attempts: 1 ?Airway Equipment and Method: Stylet ?Placement Confirmation: ETT inserted through vocal cords under direct vision, positive ETCO2 and breath sounds checked- equal and bilateral ?Secured at: 22 cm ?Tube secured with: Tape ?Dental Injury: Teeth and Oropharynx as per pre-operative assessment  ? ? ? ? ?

## 2021-12-18 NOTE — H&P (Signed)
HISTORY OF PRESENT ILLNESS: ? ?Carlos Matthews is a 61 y.o. male who presented to the emergency room with acute food impaction after consuming potato skins.  He has been unable to handle his secretions.  GI was contacted a few hours ago.  He is now set up for upper endoscopy to relieve the acute food impaction. ? ?REVIEW OF SYSTEMS: ? ?All non-GI ROS negative. ?Past Medical History:  ?Diagnosis Date  ? Diabetes mellitus without complication (HCC)   ? Hyperlipidemia   ? Renal disorder   ? Pt denies  ? ? ?Past Surgical History:  ?Procedure Laterality Date  ? COLONOSCOPY    ? last 10-27-2008- normal   ? FRACTURE SURGERY Left   ? arm  ? hand fracture surgery Left   ? NECK SURGERY    ? x2  ? ROTATOR CUFF REPAIR Bilateral   ? TONSILLECTOMY    ? VASECTOMY    ? ? ?Social History ?Carlos Matthews  reports that he has never smoked. He has never used smokeless tobacco. He reports that he does not drink alcohol and does not use drugs. ? ?family history includes Diabetes in his brother, father, maternal grandfather, mother, paternal grandmother, and sister; Early death in his brother; Hypertension in his father; Kidney disease in his mother; Lupus in his sister; Mental illness (age of onset: 70) in his father; Stroke in his brother. ? ?Allergies  ?Allergen Reactions  ? Penicillins Swelling and Rash  ?  Eyes swell shut ?Has patient had a PCN reaction causing immediate rash, facial/tongue/throat swelling, SOB or lightheadedness with hypotension: Yes ?Has patient had a PCN reaction causing severe rash involving mucus membranes or skin necrosis: Unk ?Has patient had a PCN reaction that required hospitalization: Went to ER ?Has patient had a PCN reaction occurring within the last 10 years: No ?If all of the above answers are "NO", then may proceed with Cephalosporin use. ?  ? ? ?  ? ?PHYSICAL EXAMINATION: ? ?Vital signs: BP 128/74   Pulse 68   Temp 98.6 ?F (37 ?C) (Temporal)   Resp 17   Ht 5\' 6"  (1.676 m)   Wt 84.4 kg   SpO2  95%   BMI 30.02 kg/m?  ?General: Well-developed, well-nourished, no acute distress ?HEENT: Sclerae are anicteric, conjunctiva pink. Oral mucosa intact ?Lungs: Clear ?Heart: Regular ?Abdomen: soft, nontender, nondistended, no obvious ascites, no peritoneal signs, normal bowel sounds. No organomegaly. ?Extremities: No edema ?Psychiatric: alert and oriented x3. Cooperative  ? ? ? ?ASSESSMENT: ? ?Acute food impaction ? ? ?PLAN: ? ? ?Urgent endoscopy.The nature of the procedure, as well as the risks, benefits, and alternatives were carefully and thoroughly reviewed with the patient. Ample time for discussion and questions allowed. The patient understood, was satisfied, and agreed to proceed. ? ? . Wilhemina Bonito., M.D. ?[facility] ?Division of Gastroenterology  ? ? ? ?  ?

## 2021-12-19 ENCOUNTER — Encounter (HOSPITAL_COMMUNITY): Payer: Self-pay | Admitting: Internal Medicine

## 2021-12-19 LAB — SURGICAL PATHOLOGY

## 2022-01-26 ENCOUNTER — Encounter: Payer: Self-pay | Admitting: Gastroenterology

## 2022-01-26 ENCOUNTER — Ambulatory Visit: Payer: BLUE CROSS/BLUE SHIELD | Admitting: Gastroenterology

## 2022-01-26 VITALS — BP 104/64 | HR 67 | Ht 66.0 in | Wt 194.1 lb

## 2022-01-26 DIAGNOSIS — R1319 Other dysphagia: Secondary | ICD-10-CM | POA: Diagnosis not present

## 2022-01-26 MED ORDER — PANTOPRAZOLE SODIUM 20 MG PO TBEC: 20.0000 mg | DELAYED_RELEASE_TABLET | Freq: Every day | ORAL | 11 refills | Status: AC

## 2022-02-02 ENCOUNTER — Encounter: Payer: Self-pay | Admitting: Gastroenterology

## 2022-02-02 ENCOUNTER — Ambulatory Visit (AMBULATORY_SURGERY_CENTER): Payer: BLUE CROSS/BLUE SHIELD | Admitting: Gastroenterology

## 2022-02-02 VITALS — BP 99/69 | HR 68 | Temp 98.0°F | Resp 16 | Ht 66.0 in | Wt 194.0 lb

## 2022-02-02 DIAGNOSIS — K222 Esophageal obstruction: Secondary | ICD-10-CM | POA: Diagnosis not present

## 2022-02-02 DIAGNOSIS — R1310 Dysphagia, unspecified: Secondary | ICD-10-CM

## 2022-02-02 MED ORDER — SODIUM CHLORIDE 0.9 % IV SOLN: 500.0000 mL | Freq: Once | INTRAVENOUS | Status: DC

## 2022-02-02 NOTE — Progress Notes (Signed)
  The recent H&P (dated in past 2 weeks) was reviewed, the patient was examined and there is no change in the patients condition since that H&P was completed.   Rachael Fee  02/02/2022, 1:40 PM

## 2022-02-02 NOTE — Op Note (Signed)
[facility] Patient Name: Carlos Matthews Procedure Date: 02/02/2022 1:38 PM MRN: [id number] Endoscopist: Rachael Fee , MD Age: 61 Referring MD:  Date of Birth: 1961/03/05 Gender: Male Account #: [id number] Procedure:                Upper GI endoscopy Indications:              Dysphagia, Acute esophageal food impaction, 12/2021.                            Treated endoscopically with Dr. Marina Goodell. Noted benign                            stricture at GE junction. Also a few small ulcers                            in the distal stomach. Biopsies were negative for                            H. pylori. He was put on proton pump inhibitor once                            daily Medicines:                Monitored Anesthesia Care Procedure:                Pre-Anesthesia Assessment:                           - Prior to the procedure, a History and Physical                            was performed, and patient medications and                            allergies were reviewed. The patient's tolerance of                            previous anesthesia was also reviewed. The risks                            and benefits of the procedure and the sedation                            options and risks were discussed with the patient.                            All questions were answered, and informed consent                            was obtained. Prior Anticoagulants: The patient has                            taken no previous anticoagulant or antiplatelet  agents. ASA Grade Assessment: II - A patient with                            mild systemic disease. After reviewing the risks                            and benefits, the patient was deemed in                            satisfactory condition to undergo the procedure.                           After obtaining informed consent, the endoscope was                            passed under direct vision. Throughout  the                            procedure, the patient's blood pressure, pulse, and                            oxygen saturations were monitored continuously. The                            Endoscope was introduced through the mouth, and                            advanced to the second part of duodenum. The upper                            GI endoscopy was accomplished without difficulty.                            The patient tolerated the procedure well. Scope In: Scope Out: Findings:                 One benign-appearing, intrinsic mild stenosis was                            found at the gastroesophageal junction (Schatzki's                            ring vs. focal peptic stricture). A TTS dilator was                            passed through the scope. Dilation with an 18-19-20                            mm balloon dilator was performed to 20 mm with                            usual resultant superficial mucosal disruption and  self limited minor bleeding.                           The exam was otherwise without abnormality. Complications:            No immediate complications. Estimated blood loss:                            None. Estimated Blood Loss:     Estimated blood loss: none. Impression:               - One benign-appearing, intrinsic mild stenosis was                            found at the gastroesophageal junction (Schatzki's                            ring vs. focal peptic stricture). A TTS dilator was                            passed through the scope. Dilation with an 18-19-20                            mm balloon dilator was performed to 20 mm with                            usual resultant superficial mucosal disruption and                            self limited minor bleeding.                           - The examination was otherwise normal. Recommendation:           - Patient has a contact number available for                             emergencies. The signs and symptoms of potential                            delayed complications were discussed with the                            patient. Return to normal activities tomorrow.                            Written discharge instructions were provided to the                            patient.                           - Resume previous diet.                           - Continue present medications. Rachael Fee, MD 02/02/2022 1:57:22 PM  This report has been signed electronically.

## 2022-02-02 NOTE — Progress Notes (Signed)
Report to PACU, RN, vss, BBS= Clear.  

## 2022-02-02 NOTE — Progress Notes (Signed)
Pt's states no medical or surgical changes since previsit or office visit. 

## 2022-02-02 NOTE — Patient Instructions (Signed)
YOU HAD AN ENDOSCOPIC PROCEDURE TODAY AT THE [facility]:   Refer to the procedure report that was given to you for any specific questions about what was found during the examination.  If the procedure report does not answer your questions, please call your gastroenterologist to clarify.  If you requested that your care partner not be given the details of your procedure findings, then the procedure report has been included in a sealed envelope for you to review at your convenience later.  YOU SHOULD EXPECT: Some feelings of bloating in the abdomen. Passage of more gas than usual.  Walking can help get rid of the air that was put into your GI tract during the procedure and reduce the bloating. If you had a lower endoscopy (such as a colonoscopy or flexible sigmoidoscopy) you may notice spotting of blood in your stool or on the toilet paper. If you underwent a bowel prep for your procedure, you may not have a normal bowel movement for a few days.  Please Note:  You might notice some irritation and congestion in your nose or some drainage.  This is from the oxygen used during your procedure.  There is no need for concern and it should clear up in a day or so.  SYMPTOMS TO REPORT IMMEDIATELY:  Following upper endoscopy (EGD)  Vomiting of blood or coffee ground material  New chest pain or pain under the shoulder blades  Painful or persistently difficult swallowing  New shortness of breath  Fever of 100F or higher  Black, tarry-looking stools  For urgent or emergent issues, a gastroenterologist can be reached at any hour by calling [phone]. Do not use MyChart messaging for urgent concerns.    DIET:  We do recommend a small meal at first, but then you may proceed to your regular diet.  Drink plenty of fluids but you should avoid alcoholic beverages for 24 hours.  ACTIVITY:  You should plan to take it easy for the rest of today and you should NOT DRIVE or use heavy machinery until  tomorrow (because of the sedation medicines used during the test).    FOLLOW UP: Our staff will call the number listed on your records the next business day following your procedure.  We will call around 7:15- 8:00 am to check on you and address any questions or concerns that you may have regarding the information given to you following your procedure. If we do not reach you, we will leave a message.  If you develop any symptoms (ie: fever, flu-like symptoms, shortness of breath, cough etc.) before then, please call [phone].  If you test positive for Covid 19 in the 2 weeks post procedure, please call and report this information to us.    If any biopsies were taken you will be contacted by phone or by letter within the next 1-3 weeks.  Please call us at [phone] if you have not heard about the biopsies in 3 weeks.    SIGNATURES/CONFIDENTIALITY: You and/or your care partner have signed paperwork which will be entered into your electronic medical record.  These signatures attest to the fact that that the information above on your After Visit Summary has been reviewed and is understood.  Full responsibility of the confidentiality of this discharge information lies with you and/or your care-partner.  

## 2022-02-05 ENCOUNTER — Telehealth: Payer: Self-pay | Admitting: *Deleted

## 2022-02-05 NOTE — Telephone Encounter (Signed)
No answer on first attempt follow up call. Left message.  ?

## 2022-05-22 ENCOUNTER — Ambulatory Visit (INDEPENDENT_AMBULATORY_CARE_PROVIDER_SITE_OTHER): Payer: BLUE CROSS/BLUE SHIELD

## 2022-05-22 ENCOUNTER — Ambulatory Visit (INDEPENDENT_AMBULATORY_CARE_PROVIDER_SITE_OTHER): Payer: BLUE CROSS/BLUE SHIELD | Admitting: Orthopaedic Surgery

## 2022-05-22 DIAGNOSIS — M545 Low back pain, unspecified: Secondary | ICD-10-CM

## 2022-05-22 MED ORDER — METHOCARBAMOL 750 MG PO TABS: 750.0000 mg | ORAL_TABLET | Freq: Two times a day (BID) | ORAL | 0 refills | Status: AC | PRN

## 2022-05-22 MED ORDER — PREDNISONE 5 MG (21) PO TBPK: ORAL_TABLET | ORAL | 0 refills | Status: AC

## 2022-05-22 NOTE — Progress Notes (Unsigned)
Office Visit Note   Patient: Carlos Matthews           Date of Birth: May 11, 1961           MRN: [id number] Visit Date: 05/22/2022              Requested by: Velna Hatchet, MD [address] [place],  [place] [address] PCP: Velna Hatchet, MD   Assessment & Plan: Visit Diagnoses:  1. Low back pain, unspecified back pain laterality, unspecified chronicity, unspecified whether sciatica present     Plan: Impression is midline low back pain with occasional bilateral lower extremity sciatica.  At this point, I would like to start the patient on a Sterapred taper and muscle relaxer.  I will also like to start him in outpatient physical therapy.  He is agreeable to this plan.  He is a type II diabetic but notes that he is very well controlled.  I will send in a low-dose steroid and he will keep an extra close eye on his blood sugar readings.  He has also been instructed to stop taking meloxicam or any other NSAIDs while on the steroid.  If his symptoms do not improve he will follow-up with Dr. Laurance Flatten.  Call with concerns or questions.  Follow-Up Instructions: Return if symptoms worsen or fail to improve.   Orders:  Orders Placed This Encounter  Procedures   XR Lumbar Spine 2-3 Views   Ambulatory referral to Physical Therapy   Meds ordered this encounter  Medications   predniSONE (STERAPRED UNI-PAK 21 TAB) 5 MG (21) TBPK tablet    Sig: Take as directed.  Do not take with other nsaids    Dispense:  21 tablet    Refill:  0   methocarbamol (ROBAXIN-750) 750 MG tablet    Sig: Take 1 tablet (750 mg total) by mouth 2 (two) times daily as needed for muscle spasms.    Dispense:  20 tablet    Refill:  0      Procedures: No procedures performed   Clinical Data: No additional findings.   Subjective: Chief Complaint  Patient presents with   Lower Back - Pain    HPI patient is a pleasant 61 year old gentleman who comes in today with chronic midline low back pain with occasional  bilateral lower extremity radiculopathy.  This is been ongoing for a couple of months without any specific injury or change in activity.  The pain is intermittent but does appear to occur with walking as well as certain movements.  He does note occasional weakness in both legs.  He has been taking meloxicam without relief.  He denies any paresthesias to either lower extremity.  He has not been to physical therapy or had an ESI.  No previous surgery on the lumbar spine.  He denies any bowel or bladder change or saddle paresthesias.  Review of Systems as detailed in HPI.  All others reviewed and are negative.   Objective: Vital Signs: There were no vitals taken for this visit.  Physical Exam well-developed well-nourished gentleman in no acute distress.  Alert and oriented x3.  Ortho Exam lumbar spine exam shows no spinous or paraspinous tenderness.  He has slight increased pain with lumbar flexion.  No pain with lumbar extension or rotation.  Negative straight leg raise both sides.  No focal weakness.  He is neurovascularly intact distally.  Specialty Comments:  No specialty comments available.  Imaging: No results found.   PMFS History: Patient Active Problem List  Diagnosis Date Noted   Food impaction of esophagus    Esophageal dysphagia    Acute gastric ulcer without hemorrhage or perforation    Elevated amylase and lipase 02/09/2021   Diabetes mellitus without complication (HCC) 04/01/2020   Microcytic anemia 09/21/2014   Sleep disorder 09/17/2013   Health care maintenance 11/16/2011   Hyperlipidemia LDL goal < 100 11/16/2011   Type 2 diabetes mellitus not at goal [facility]) 11/16/2011   Obesity (BMI 30.0-34.9) 11/16/2011   Past Medical History:  Diagnosis Date   Diabetes mellitus without complication (HCC)    Hyperlipidemia    Renal disorder    Pt denies    Family History  Problem Relation Age of Onset   Diabetes Mother    Kidney disease Mother    Diabetes Father     Hypertension Father    Mental illness Father 67       Alzheimer's dementia   Diabetes Sister    Lupus Sister    Diabetes Brother    Stroke Brother    Early death Brother    Diabetes Maternal Grandfather    Diabetes Paternal Grandmother    Colon polyps Neg Hx    Colon cancer Neg Hx    Esophageal cancer Neg Hx    Rectal cancer Neg Hx    Stomach cancer Neg Hx    Pancreatic cancer Neg Hx     Past Surgical History:  Procedure Laterality Date   BIOPSY  12/18/2021   Procedure: BIOPSY;  Surgeon: Hilarie Fredrickson, MD;  Location: [facility];  Service: Gastroenterology;;   COLONOSCOPY     last 10-27-2008- normal    ESOPHAGOGASTRODUODENOSCOPY N/A 12/18/2021   Procedure: ESOPHAGOGASTRODUODENOSCOPY (EGD);  Surgeon: Hilarie Fredrickson, MD;  Location: [facility];  Service: Gastroenterology;  Laterality: N/A;   FRACTURE SURGERY Left    arm   hand fracture surgery Left    NECK SURGERY     x2   ROTATOR CUFF REPAIR Bilateral    TONSILLECTOMY     VASECTOMY     Social History   Occupational History   Not on file  Tobacco Use   Smoking status: Never   Smokeless tobacco: Never  Vaping Use   Vaping Use: Never used  Substance and Sexual Activity   Alcohol use: No    Alcohol/week: 0.0 standard drinks of alcohol   Drug use: No   Sexual activity: Not on file

## 2022-05-23 ENCOUNTER — Ambulatory Visit (INDEPENDENT_AMBULATORY_CARE_PROVIDER_SITE_OTHER): Payer: BLUE CROSS/BLUE SHIELD

## 2022-05-23 ENCOUNTER — Ambulatory Visit (INDEPENDENT_AMBULATORY_CARE_PROVIDER_SITE_OTHER): Payer: BLUE CROSS/BLUE SHIELD | Admitting: Orthopedic Surgery

## 2022-05-23 VITALS — Ht 69.0 in | Wt 194.0 lb

## 2022-05-23 DIAGNOSIS — M542 Cervicalgia: Secondary | ICD-10-CM

## 2022-05-23 NOTE — Progress Notes (Addendum)
Orthopedic Spine Surgery Office Note  Assessment: Patient is a 61 y.o. male with prior C5-7 ACDF.  Patient is concerned about loose instrumentation which was seen on x-ray.  Having occasional neck pain and dysphagia, but these symptoms self-resolve.   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  I told him that we would continue to monitor this as well since he has intermittent and tolerable symptoms. -Recommended serial x-ray monitoring -I informed him that it would be helpful to get a copy of one of his original XRs after the surgery in 2019. He was going to work on getting a copy so I could review it at our next visit.  -Patient should return to office in 12 weeks, repeat x-rays of cervical spine at next visit: AP, lateral, flexion/extension cervical x-rays   Patient expressed understanding of the plan and all questions were answered to the patient's satisfaction.   ___________________________________________________________________________   History:  Patient is a 61 y.o. male who presents today for cervical spine.  Patient has a history of 2 prior ACDF's.  His first 1 was at C5-6 and then he underwent another ACDF at C6-7.  He states that the second ACDF was done in 2019.  He states that the ACDF's were done for worn-out disks.  He did not have any lasting issues with swallowing after the second ACDF in 2019.  He notes within the last 2 years he has developed occasional difficulty with swallowing.  This resolves with time. He also gets occasional pain in his neck but nothing consistent or chronic.  There was no trauma or injury that brought on this neck pain about 2 years ago.  He does not have pain that radiates into his arms.  He reports that he had a procedure done to look at his  esophagus.  He he described sound like an EGD.  He reports that his esophagus was dilated during that procedure.  He tells me that there was no findings within his esophagus during the scope.  On my review of Dr. Eugenia Pancoast note from 01/26/2022, he notes that the patient underwent endoscopic treatment for food impaction on 12/2021.  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  Dr. Ardis Hughs note states that the patient had no dysphagia before or after the food impaction episode.   Weakness: Denies Difficulty with fine motor skills (e.g., buttoning shirts, handwriting): Denies Symptoms of imbalance: Denies Paresthesias and numbness: Denies Bowel or bladder incontinence: Denies Saddle anesthesia: Denies  Treatments tried: EGD and dilation  Review of systems: Denies fevers and chills, night sweats, unexplained weight loss, history of cancer, pain that wakes them at night  Past medical history: Diabetes  Allergies: Penicillin  Past surgical history:  ACDF x2 Rotator cuff x2 Arm surgery Tonsillectomy  Social history: Denies use of nicotine product (smoking, vaping, patches, smokeless) Alcohol use: Denies Denies recreational drug use  Physical Exam:  General: no acute distress, appears stated age Neurologic: alert, answering questions appropriately, following commands Respiratory: unlabored breathing on room air, symmetric chest rise Psychiatric: appropriate affect, normal cadence to speech  Prior incision over the right side of the anterior neck appears well-healed with no evidence of infection  MSK (spine):  -Strength exam      Left  Right Grip strength  5/5  5/5 Interosseus   5/5   5/5 Wrist extension  5/5  5/5 Wrist flexion   5/5  5/5 Elbow flexion   5/5  5/5 Deltoid    5/5  5/5   -Sensory exam    Sensation intact to light touch in C5-T1 nerve distributions of bilateral upper extremities  -Brachioradialis DTR: 1/4 on  the left, 1/4 on the right -Biceps DTR: 1/4 on the left, 1/4 on the right  -Spurling: Negative bilaterally -Hoffman sign: Negative bilaterally -Clonus: No beats bilaterally -Interosseous wasting: None seen -Gait: Normal  Imaging: XR of the cervical spine from 05/23/2022 was independently reviewed and interpreted, showing prior C5-7 ACDF. There is lucency around the C7 screws. Lucency seen through the interbody space at both levels. One of the C7 screws appears slightly out of the plate Interspinous motion does change greater than 49mm at both levels between flexion and extension.   He showed me a picture on his phone which he said he took a couple years after the surgery that showed a lateral C-spine image status post two-level ACDF.  It showed the C7 screw with lucency around it and that one of the C7 screws was not completely flush with the plate.  That C7 screw appeared similar to how it appeared on today's cervical spine radiographs.  CT of the cervical spine from 11/02/2020 was independently reviewed and interpreted, showing lucency through both the interbody devices. The C7 screws have subtle lucency around them. The anterior cervical plate is not flush with the anterior cortex of C7.    Patient name: Carlos Matthews Patient MRN: [id number] Date of visit: 05/23/22

## 2022-05-25 ENCOUNTER — Ambulatory Visit: Payer: 59 | Admitting: Orthopedic Surgery

## 2022-05-29 ENCOUNTER — Ambulatory Visit: Payer: BLUE CROSS/BLUE SHIELD | Admitting: Podiatry

## 2022-05-29 DIAGNOSIS — E119 Type 2 diabetes mellitus without complications: Secondary | ICD-10-CM

## 2022-05-29 NOTE — Progress Notes (Signed)
   Chief Complaint  Patient presents with   foot exam    Patient is here for diabetic foot exam.    HPI: 61 y.o. male presenting today with his spouse for encounter for diabetic foot exam.  Patient has no pain or tenderness to the feet.  No concerns.  Last A1c 10/11/2021 was 7.8.  Past Medical History:  Diagnosis Date   Diabetes mellitus without complication ([place])    Hyperlipidemia    Renal disorder    Pt denies    Past Surgical History:  Procedure Laterality Date   BIOPSY  12/18/2021   Procedure: BIOPSY;  Surgeon: Irene Shipper, MD;  Location: [facility];  Service: Gastroenterology;;   COLONOSCOPY     last 10-27-2008- normal    ESOPHAGOGASTRODUODENOSCOPY N/A 12/18/2021   Procedure: ESOPHAGOGASTRODUODENOSCOPY (EGD);  Surgeon: Irene Shipper, MD;  Location: [facility];  Service: Gastroenterology;  Laterality: N/A;   FRACTURE SURGERY Left    arm   hand fracture surgery Left    NECK SURGERY     x2   ROTATOR CUFF REPAIR Bilateral    TONSILLECTOMY     VASECTOMY      Allergies  Allergen Reactions   Penicillins Swelling and Rash    Eyes swell shut Has patient had a PCN reaction causing immediate rash, facial/tongue/throat swelling, SOB or lightheadedness with hypotension: Yes Has patient had a PCN reaction causing severe rash involving mucus membranes or skin necrosis: Unk Has patient had a PCN reaction that required hospitalization: Went to ER Has patient had a PCN reaction occurring within the last 10 years: No If all of the above answers are "NO", then may proceed with Cephalosporin use.      Physical Exam: General: The patient is alert and oriented x3 in no acute distress.  Dermatology: Skin is warm, dry and supple bilateral lower extremities. Negative for open lesions or macerations.  Vascular: Palpable pedal pulses bilaterally. Capillary refill within normal limits.  Negative for any significant edema or erythema  Neurological: Light touch and protective threshold  grossly intact  Musculoskeletal Exam: No pedal deformities noted   Assessment: 1.  Encounter for diabetic foot exam   Plan of Care:  1. Patient evaluated.  2.  Comprehensive diabetic foot exam performed today.  WNL 3.  Continue wearing good supportive shoes and sneakers.  Maintain good foot hygiene 4.  Continue close management with PCP for glucose management and diabetes control 5 return to clinic annually   Edrick Kins, DPM [facility]  Dr. Edrick Kins, DPM    [address], [place] [address]                Office [phone]  Fax [phone]

## 2022-06-01 NOTE — Therapy (Deleted)
OUTPATIENT PHYSICAL THERAPY THORACOLUMBAR EVALUATION   Patient Name: Carlos Matthews MRN: [id number] DOB:1961/05/28, 61 y.o., male Today's Date: 06/01/2022    Past Medical History:  Diagnosis Date   Diabetes mellitus without complication (HCC)    Hyperlipidemia    Renal disorder    Pt denies   Past Surgical History:  Procedure Laterality Date   BIOPSY  12/18/2021   Procedure: BIOPSY;  Surgeon: Hilarie Fredrickson, MD;  Location: [facility];  Service: Gastroenterology;;   COLONOSCOPY     last 10-27-2008- normal    ESOPHAGOGASTRODUODENOSCOPY N/A 12/18/2021   Procedure: ESOPHAGOGASTRODUODENOSCOPY (EGD);  Surgeon: Hilarie Fredrickson, MD;  Location: [facility];  Service: Gastroenterology;  Laterality: N/A;   FRACTURE SURGERY Left    arm   hand fracture surgery Left    NECK SURGERY     x2   ROTATOR CUFF REPAIR Bilateral    TONSILLECTOMY     VASECTOMY     Patient Active Problem List   Diagnosis Date Noted   Food impaction of esophagus    Esophageal dysphagia    Acute gastric ulcer without hemorrhage or perforation    Elevated amylase and lipase 02/09/2021   Diabetes mellitus without complication (HCC) 04/01/2020   Microcytic anemia 09/21/2014   Sleep disorder 09/17/2013   Health care maintenance 11/16/2011   Hyperlipidemia LDL goal < 100 11/16/2011   Type 2 diabetes mellitus not at goal (HCC) 11/16/2011   Obesity (BMI 30.0-34.9) 11/16/2011    PCP: Alysia Penna, MD  REFERRING PROVIDER: Cristie Hem, PA-C   REFERRING DIAG: M54.50 (ICD-10-CM) - Low back pain, unspecified back pain laterality, unspecified chronicity, unspecified whether sciatica present   Rationale for Evaluation and Treatment: Rehabilitation  THERAPY DIAG:  No diagnosis found.  ONSET DATE: ***  SUBJECTIVE:                                                                                                                                                                                            SUBJECTIVE STATEMENT: ***  PERTINENT HISTORY:  DM, HLD, hx ACDF, bil RTC repair  PAIN:  Are you having pain? {OPRCPAIN:27236}  PRECAUTIONS: {Therapy precautions:24002}  WEIGHT BEARING RESTRICTIONS: {Yes ***/No:24003}  FALLS:  Has patient fallen in last 6 months? {fallsyesno:27318}  LIVING ENVIRONMENT: Lives with: {OPRC lives with:25569::"lives with their family"} Lives in: {Lives in:25570} Stairs: {opstairs:27293} Has following equipment at home: {Assistive devices:23999}  OCCUPATION: ***  PLOF: {PLOF:24004}  PATIENT GOALS: ***   OBJECTIVE:   DIAGNOSTIC FINDINGS:  ***  PATIENT SURVEYS:  06/04/22: FOTO ***  SCREENING FOR RED FLAGS: Bowel or bladder incontinence: {Yes/No:304960894} Spinal tumors: {Yes/No:304960894} Cauda equina  syndrome: {Yes/No:304960894} Compression fracture: {Yes/No:304960894} Abdominal aneurysm: {Yes/No:304960894}  COGNITION: Overall cognitive status: {cognition:24006}     SENSATION: {sensation:27233}  MUSCLE LENGTH: Hamstrings: Right *** deg; Left *** deg Marcello Moores test: Right *** deg; Left *** deg  POSTURE: {posture:25561}  PALPATION: ***  LUMBAR ROM:   AROM eval  Flexion   Extension   Right lateral flexion   Left lateral flexion   Right rotation   Left rotation    (Blank rows = not tested)  LOWER EXTREMITY ROM:     {AROM/PROM:27142}  Right eval Left eval  Hip flexion    Hip extension    Hip abduction    Hip adduction    Hip internal rotation    Hip external rotation    Knee flexion    Knee extension    Ankle dorsiflexion    Ankle plantarflexion    Ankle inversion    Ankle eversion     (Blank rows = not tested)  LOWER EXTREMITY MMT:    MMT Right eval Left eval  Hip flexion    Hip extension    Hip abduction    Hip adduction    Hip internal rotation    Hip external rotation    Knee flexion    Knee extension    Ankle dorsiflexion    Ankle plantarflexion    Ankle inversion    Ankle eversion      (Blank rows = not tested)  LUMBAR SPECIAL TESTS:  {lumbar special test:25242}  FUNCTIONAL TESTS:  {Functional tests:24029}  GAIT: Distance walked: *** Assistive device utilized: {Assistive devices:23999} Level of assistance: {Levels of assistance:24026} Comments: ***  TODAY'S TREATMENT:                                                                                                                              DATE: ***    PATIENT EDUCATION:  Education details: *** Person educated: {Person educated:25204} Education method: {Education Method:25205} Education comprehension: {Education Comprehension:25206}  HOME EXERCISE PROGRAM: ***  ASSESSMENT:  CLINICAL IMPRESSION: Patient is a 61 y.o. male who was seen today for physical therapy evaluation and treatment for LBP.  He demonstrates ***.  He will benefit from PT to address deficits listed.   OBJECTIVE IMPAIRMENTS: {opptimpairments:25111}.   ACTIVITY LIMITATIONS: {activitylimitations:27494}  PARTICIPATION LIMITATIONS: {participationrestrictions:25113}  PERSONAL FACTORS: {Personal factors:25162} are also affecting patient's functional outcome.   REHAB POTENTIAL: {rehabpotential:25112}  CLINICAL DECISION MAKING: {clinical decision making:25114}  EVALUATION COMPLEXITY: {Evaluation complexity:25115}   GOALS: Goals reviewed with patient? {yes/no:20286}  SHORT TERM GOALS: Target date: {follow up:25551}  Independent with initial HEP Goal status: INITIAL  2.  *** Goal status: {GOALSTATUS:25110}  3.  *** Goal status: {GOALSTATUS:25110}  4.  *** Goal status: {GOALSTATUS:25110}  5.  *** Goal status: {GOALSTATUS:25110}  6.  *** Goal status: {GOALSTATUS:25110}  LONG TERM GOALS: Target date: {follow up:25551}  Independent with final HEP Goal status: INITIAL  2.  FOTO score improved to *** Goal status: INITIAL  3.  *** improved to *** for *** Goal status: INIITAL  4.  Report pain < ***/10 with *** for  improved function Goal status: INITIAL  5.  *** Goal status: {GOALSTATUS:25110}  6.  *** Goal status: {GOALSTATUS:25110}   PLAN:  PT FREQUENCY: {rehab frequency:25116}  PT DURATION: {rehab duration:25117}  PLANNED INTERVENTIONS: {rehab planned interventions:25118::"Therapeutic exercises","Therapeutic activity","Neuromuscular re-education","Balance training","Gait training","Patient/Family education","Self Care","Joint mobilization"}.  PLAN FOR NEXT SESSION: ***   Moshe Cipro, PT 06/01/2022, 11:54 AM

## 2022-06-04 ENCOUNTER — Ambulatory Visit: Payer: BLUE CROSS/BLUE SHIELD | Admitting: Physical Therapy

## 2022-08-23 ENCOUNTER — Ambulatory Visit: Payer: 59 | Admitting: Orthopedic Surgery

## 2023-01-25 ENCOUNTER — Other Ambulatory Visit: Payer: Self-pay | Admitting: Internal Medicine

## 2023-01-29 NOTE — Telephone Encounter (Signed)
Dr Adela Lank,  This is a patient of Dr Christella Hartigan.  Please advise on refills as you are DOD.  I do see at last office visit with Dr Christella Hartigan, that he decreased pantoprazole from 40mg  to 20mg .  Thank you

## 2023-01-29 NOTE — Telephone Encounter (Signed)
Yes can refill. Would give him the dose he is requesting. If he needs the 40mg  we can refill that, if he did okay on the 20mg  that would be preferred, but if it did not work for him can give him the 40mg  dose. #90 RF1, he should try to f/u with [place] within 6 months or so. Thanks

## 2023-01-30 ENCOUNTER — Telehealth: Payer: Self-pay | Admitting: Gastroenterology

## 2023-01-30 NOTE — Telephone Encounter (Signed)
Patient returned call and is aware that he is scheduled to follow up with [facility], [facility] on 04-11-23 at 11:00 am.  Patient stated that he does not need refills of his pantoprazole at this time, as he is rarely taking medications.  Patient agreed to plan and verbalized understanding.  No further questions.

## 2023-01-30 NOTE — Telephone Encounter (Signed)
Patient returned call and is aware that he is scheduled to follow up with [facility], [facility] on 04-11-23 at 11:00 am.  Patient stated that he does not need refills of his pantoprazole at this time, as he is rarely taking medications.  Patient agreed to plan and verbalized understanding.  No further questions.  

## 2023-01-30 NOTE — Telephone Encounter (Signed)
Inbound call from patient stating he received a phone call from [facility]. Did not know what the call was regarding. Please advise, thank you.

## 2023-01-30 NOTE — Telephone Encounter (Signed)
Left message for patient to return call to further discuss pantoprazole dose and follow up appointment.  Will continue efforts.

## 2023-04-11 ENCOUNTER — Ambulatory Visit: Payer: 59 | Admitting: Gastroenterology

## 2023-06-03 ENCOUNTER — Ambulatory Visit: Payer: BLUE CROSS/BLUE SHIELD | Admitting: Podiatry

## 2023-10-14 IMAGING — DX DG CHEST 1V PORT
1 series · 1 of 1 positions shown · non-contrast
Comparison: None Available.

CLINICAL DATA: Chest pain, evaluate for esophageal foreign body

EXAM:
PORTABLE CHEST 1 VIEW

[chest ap]
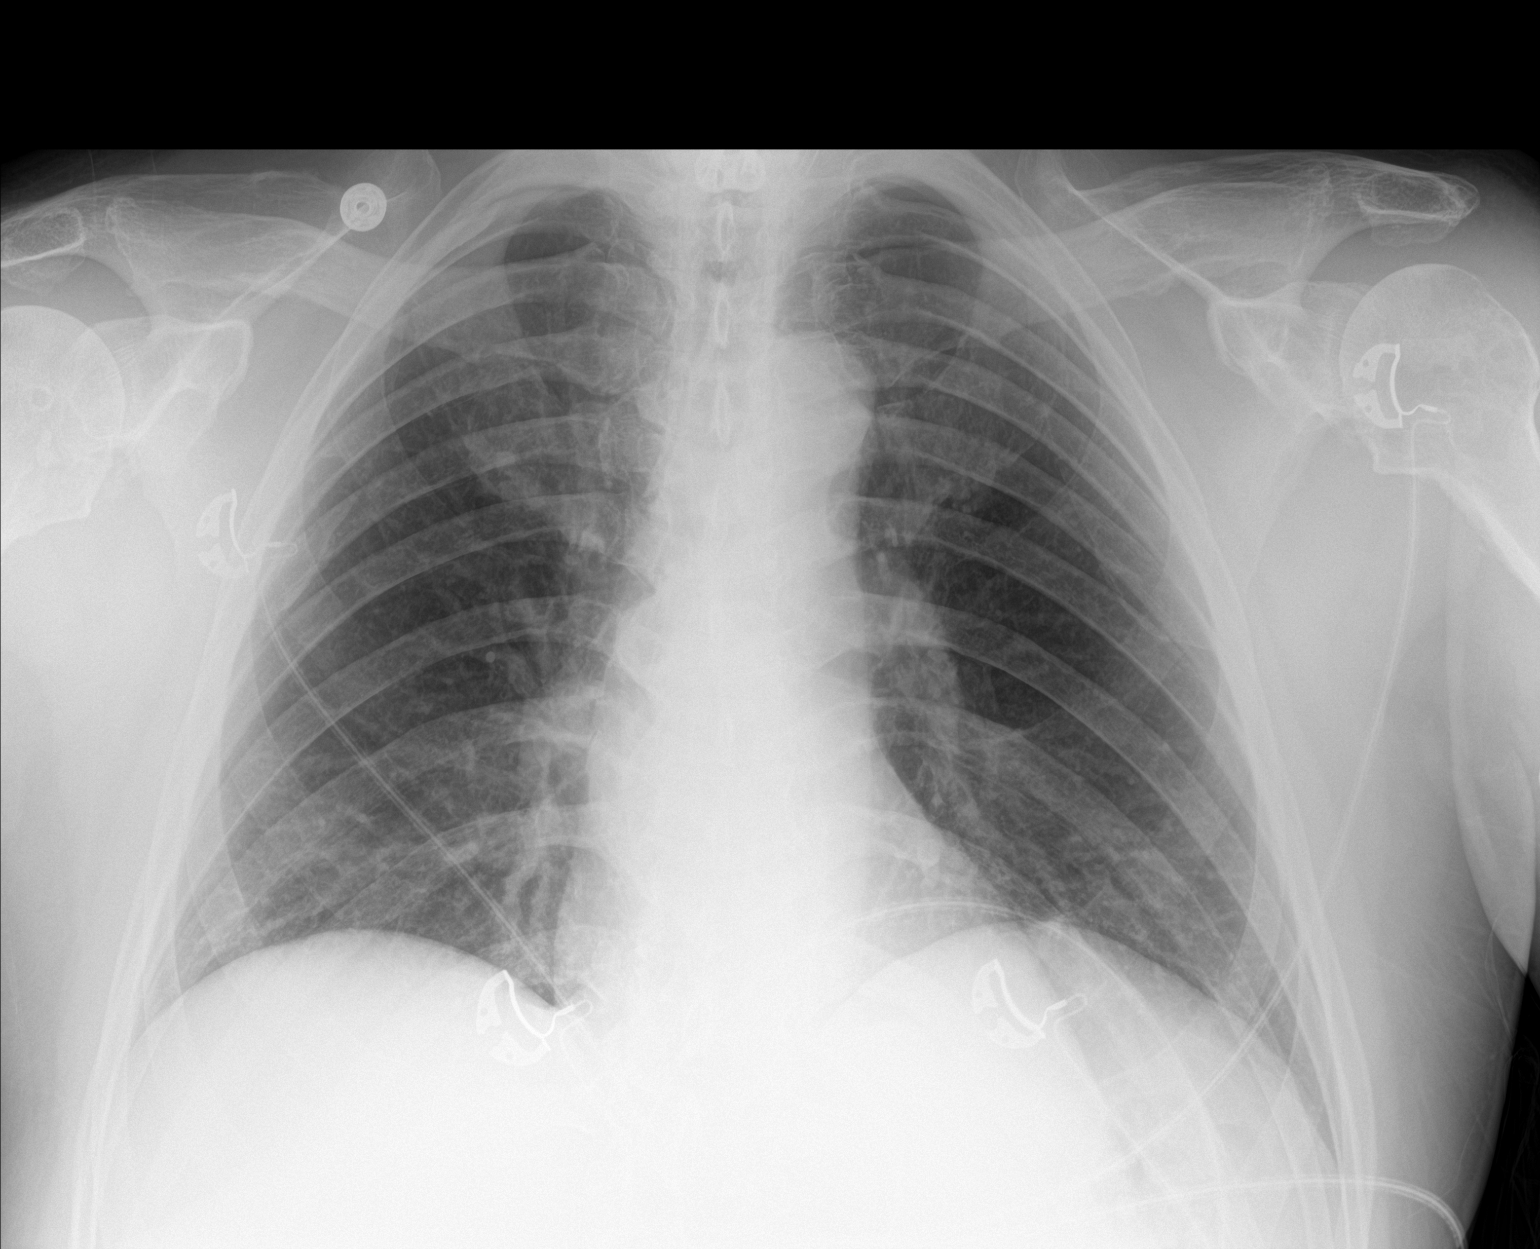

[1 of 1 positions shown; findings below may reference images not displayed]

FINDINGS: Lungs are clear.  No pleural effusion or pneumothorax.

The heart is normal in size.

Cervical spine fixation hardware.

No radiopaque foreign body is seen.
IMPRESSION: No evidence of acute cardiopulmonary disease.

No radiopaque foreign body is seen.
